# Patient Record
Sex: Female | Born: 1975 | Race: Black or African American | Hispanic: No | Marital: Married | Smoking: Never smoker
Health system: Southern US, Community
[De-identification: ages and names within clinical notes are randomized; demographics above are authoritative.]

## PROBLEM LIST (undated history)

## (undated) DIAGNOSIS — I10 Essential (primary) hypertension: Secondary | ICD-10-CM

## (undated) DIAGNOSIS — E079 Disorder of thyroid, unspecified: Secondary | ICD-10-CM

## (undated) HISTORY — PX: CHOLECYSTECTOMY: SHX55

---

## 2011-03-04 ENCOUNTER — Emergency Department (HOSPITAL_COMMUNITY)
Admission: EM | Admit: 2011-03-04 | Discharge: 2011-03-05 | Disposition: A | Discharge status: Home / Self Care | Payer: Self-pay | Attending: Emergency Medicine | Admitting: Emergency Medicine

## 2011-03-04 ENCOUNTER — Encounter: Payer: Self-pay | Admitting: *Deleted

## 2011-03-04 DIAGNOSIS — R51 Headache: Secondary | ICD-10-CM | POA: Insufficient documentation

## 2011-03-04 DIAGNOSIS — R209 Unspecified disturbances of skin sensation: Secondary | ICD-10-CM | POA: Insufficient documentation

## 2011-03-04 DIAGNOSIS — I10 Essential (primary) hypertension: Secondary | ICD-10-CM | POA: Insufficient documentation

## 2011-03-04 DIAGNOSIS — Z79899 Other long term (current) drug therapy: Secondary | ICD-10-CM | POA: Insufficient documentation

## 2011-03-04 DIAGNOSIS — E876 Hypokalemia: Secondary | ICD-10-CM | POA: Insufficient documentation

## 2011-03-04 DIAGNOSIS — R202 Paresthesia of skin: Secondary | ICD-10-CM

## 2011-03-04 HISTORY — DX: Essential (primary) hypertension: I10

## 2011-03-04 LAB — POCT I-STAT, CHEM 8
Creatinine, Ser: 0.8 mg/dL (ref 0.50–1.10)
Hemoglobin: 14.6 g/dL (ref 12.0–15.0)
Potassium: 2.7 mEq/L — CL (ref 3.5–5.1)
Sodium: 139 mEq/L (ref 135–145)
TCO2: 30 mmol/L (ref 0–100)

## 2011-03-04 MED ORDER — POTASSIUM CHLORIDE CRYS ER 20 MEQ PO TBCR
40.0000 meq | EXTENDED_RELEASE_TABLET | Freq: Once | ORAL | Status: AC
  Administered 2011-03-05: 40 meq via ORAL
  Filled 2011-03-04: qty 2

## 2011-03-04 MED ORDER — POTASSIUM CHLORIDE 10 MEQ/100ML IV SOLN
10.0000 meq | Freq: Once | INTRAVENOUS | Status: AC
  Administered 2011-03-05: 10 meq via INTRAVENOUS
  Filled 2011-03-04: qty 100

## 2011-03-04 NOTE — ED Notes (Signed)
Having blood drawn at this time

## 2011-03-04 NOTE — ED Notes (Signed)
Pt in c/o episode last night where she felt like the right side of her face was numb, no drop noted per family, pt states this resolved but she also had a headache behind her right eye, today had episode of aphasia while on the phone with husband while trying to say one word, checked her BP at home and it was elevated, pt states she is compliant with her medication

## 2011-03-05 MED ORDER — POTASSIUM CHLORIDE ER 10 MEQ PO TBCR: 20.0000 meq | EXTENDED_RELEASE_TABLET | Freq: Two times a day (BID) | ORAL | Status: DC

## 2011-03-05 MED ORDER — SODIUM CHLORIDE 0.9 % IV SOLN
Freq: Once | INTRAVENOUS | Status: AC
  Administered 2011-03-05: via INTRAVENOUS

## 2011-03-05 NOTE — ED Provider Notes (Signed)
Medical screening examination/treatment/procedure(s) were performed by non-physician practitioner and as supervising physician I was immediately available for consultation/collaboration.   Lyanne Co, MD 03/05/11 [phone]

## 2011-03-05 NOTE — ED Provider Notes (Signed)
History     CSN: [id number]  Arrival date & time 03/04/11  1803   First MD Initiated Contact with Patient 03/04/11 2305      Chief Complaint  Patient presents with  . Hypertension  . Headache     HPI  History provided patient. Patient is a 35 year old female with history of hypertension presents with complaints of feeling tingling and numbness on the right side of her face and bilateral upper extremities. Patient reports that symptoms have been somewhat intermittent and gradual. they first began last night. Patient also reports having some difficulty getting out her words on the telephone earlier today. This happened only once during one conversation she has been speaking normally otherwise. She denies any weakness in her arms or legs. Patient does report having a recent GI illness with a lot of diarrhea up until yesterday. Today she denies any abdominal pains or diarrhea. She has not had any nausea or vomiting. She denies any fever, chills, sweats. She does also note that her blood pressure was a little elevated today with systolic 175.    Past Medical History  Diagnosis Date  . Hypertension     History reviewed. No pertinent past surgical history.  History reviewed. No pertinent family history.  History  Substance Use Topics  . Smoking status: Never Smoker   . Smokeless tobacco: Not on file  . Alcohol Use: No    OB History    Grav Para Term Preterm Abortions TAB SAB Ect Mult Living                  Review of Systems  Constitutional: Negative for fever and chills.  Gastrointestinal: Positive for diarrhea. Negative for nausea, vomiting and abdominal pain.  Neurological: Positive for numbness and headaches. Negative for facial asymmetry and weakness.  All other systems reviewed and are negative.    Allergies  Review of patient's allergies indicates no known allergies.  Home Medications   Current Outpatient Rx  Name Route Sig Dispense Refill  . ACETAMINOPHEN  500 MG PO TABS Oral Take 500 mg by mouth every 6 (six) hours as needed. Headache     . HYDROCHLOROTHIAZIDE 25 MG PO TABS Oral Take 25 mg by mouth daily.        BP 152/76  Pulse 76  Temp(Src) 98 F (36.7 C) (Oral)  Resp 20  SpO2 100%  Physical Exam  Nursing note and vitals reviewed. Constitutional: She is oriented to person, place, and time. She appears well-developed and well-nourished. No distress.  HENT:  Head: Normocephalic.  Mouth/Throat: Oropharynx is clear and moist.  Eyes: Conjunctivae and EOM are normal. Pupils are equal, round, and reactive to light.  Neck: Normal range of motion. Neck supple.  Cardiovascular: Normal rate and regular rhythm.   No murmur heard. Pulmonary/Chest: Effort normal and breath sounds normal. She has no wheezes. She has no rales.  Abdominal: Soft. There is no tenderness. There is no rebound and no guarding.  Neurological: She is alert and oriented to person, place, and time. She has normal strength. No cranial nerve deficit or sensory deficit. Coordination and gait normal.  Skin: Skin is warm and dry. No rash noted.  Psychiatric: She has a normal mood and affect. Her behavior is normal.    ED Course  Procedures (including critical care time)  Labs Reviewed  POCT I-STAT, CHEM 8 - Abnormal; Notable for the following:    Potassium 2.7 (*)    Glucose, Bld 126 (*)  All other components within normal limits  I-STAT, CHEM 8   Results for orders placed during the hospital encounter of 03/04/11  POCT I-STAT, CHEM 8      Component Value Range   Sodium 139  135 - 145 (mEq/L)   Potassium 2.7 (*) 3.5 - 5.1 (mEq/L)   Chloride 99  96 - 112 (mEq/L)   BUN 11  6 - 23 (mg/dL)   Creatinine, Ser 1.61  0.50 - 1.10 (mg/dL)   Glucose, Bld 096 (*) 70 - 99 (mg/dL)   Calcium, Ion 0.45  4.09 - 1.32 (mmol/L)   TCO2 30  0 - 100 (mmol/L)   Hemoglobin 14.6  12.0 - 15.0 (g/dL)   HCT 81.1  91.4 - 78.2 (%)   Comment NOTIFIED PHYSICIAN        1. Hypokalemia     2. Paresthesias       MDM  Patient seen and evaluated. Patient no acute distress.   Patient found to be hypokalemic. By mouth and IV potassium ordered.     Angus Seller, [place] 03/05/11 [phone]

## 2012-05-10 ENCOUNTER — Emergency Department (HOSPITAL_COMMUNITY): Payer: PRIVATE HEALTH INSURANCE

## 2012-05-10 ENCOUNTER — Encounter (HOSPITAL_COMMUNITY): Payer: Self-pay | Admitting: Emergency Medicine

## 2012-05-10 ENCOUNTER — Emergency Department (HOSPITAL_COMMUNITY)
Admission: EM | Admit: 2012-05-10 | Discharge: 2012-05-10 | Disposition: A | Discharge status: Home / Self Care | Payer: PRIVATE HEALTH INSURANCE | Attending: Emergency Medicine | Admitting: Emergency Medicine

## 2012-05-10 DIAGNOSIS — Z3201 Encounter for pregnancy test, result positive: Secondary | ICD-10-CM | POA: Insufficient documentation

## 2012-05-10 DIAGNOSIS — O009 Unspecified ectopic pregnancy without intrauterine pregnancy: Secondary | ICD-10-CM

## 2012-05-10 DIAGNOSIS — Z9089 Acquired absence of other organs: Secondary | ICD-10-CM | POA: Insufficient documentation

## 2012-05-10 DIAGNOSIS — Z79899 Other long term (current) drug therapy: Secondary | ICD-10-CM | POA: Insufficient documentation

## 2012-05-10 DIAGNOSIS — R630 Anorexia: Secondary | ICD-10-CM | POA: Insufficient documentation

## 2012-05-10 DIAGNOSIS — K5732 Diverticulitis of large intestine without perforation or abscess without bleeding: Secondary | ICD-10-CM | POA: Insufficient documentation

## 2012-05-10 DIAGNOSIS — R197 Diarrhea, unspecified: Secondary | ICD-10-CM | POA: Insufficient documentation

## 2012-05-10 DIAGNOSIS — N921 Excessive and frequent menstruation with irregular cycle: Secondary | ICD-10-CM | POA: Insufficient documentation

## 2012-05-10 DIAGNOSIS — I10 Essential (primary) hypertension: Secondary | ICD-10-CM | POA: Insufficient documentation

## 2012-05-10 DIAGNOSIS — R110 Nausea: Secondary | ICD-10-CM | POA: Insufficient documentation

## 2012-05-10 LAB — CBC WITH DIFFERENTIAL/PLATELET
Basophils Absolute: 0 10*3/uL (ref 0.0–0.1)
Lymphocytes Relative: 27 % (ref 12–46)
Lymphs Abs: 2.1 10*3/uL (ref 0.7–4.0)
MCV: 80.1 fL (ref 78.0–100.0)
Neutro Abs: 4.8 10*3/uL (ref 1.7–7.7)
Neutrophils Relative %: 62 % (ref 43–77)
Platelets: 353 10*3/uL (ref 150–400)
RBC: 4.62 MIL/uL (ref 3.87–5.11)
RDW: 14 % (ref 11.5–15.5)
WBC: 7.7 10*3/uL (ref 4.0–10.5)

## 2012-05-10 LAB — COMPREHENSIVE METABOLIC PANEL
ALT: 20 U/L (ref 0–35)
AST: 20 U/L (ref 0–37)
Alkaline Phosphatase: 77 U/L (ref 39–117)
CO2: 26 mEq/L (ref 19–32)
Calcium: 9.2 mg/dL (ref 8.4–10.5)
Chloride: 100 mEq/L (ref 96–112)
GFR calc Af Amer: >90 mL/min (ref >90)
GFR calc non Af Amer: >90 mL/min (ref >90)
Glucose, Bld: 93 mg/dL (ref 70–99)
Potassium: 3.6 mEq/L (ref 3.5–5.1)
Sodium: 137 mEq/L (ref 135–145)

## 2012-05-10 LAB — URINE MICROSCOPIC-ADD ON

## 2012-05-10 LAB — URINALYSIS, ROUTINE W REFLEX MICROSCOPIC
Bilirubin Urine: NEGATIVE
Glucose, UA: NEGATIVE mg/dL
Protein, ur: NEGATIVE mg/dL
Urobilinogen, UA: 0.2 mg/dL (ref 0.0–1.0)

## 2012-05-10 LAB — HCG, QUANTITATIVE, PREGNANCY: hCG, Beta Chain, Quant, S: 540 m[IU]/mL — ABNORMAL HIGH (ref <5)

## 2012-05-10 LAB — PREGNANCY, URINE: Preg Test, Ur: POSITIVE — AB

## 2012-05-10 MED ORDER — METHOTREXATE 2.5 MG PO TABS: 50.0000 mg/m2 | ORAL_TABLET | Freq: Once | ORAL | Status: DC

## 2012-05-10 MED ORDER — ONDANSETRON HCL 4 MG/2ML IJ SOLN
4.0000 mg | Freq: Once | INTRAMUSCULAR | Status: AC
  Administered 2012-05-10: 4 mg via INTRAVENOUS
  Filled 2012-05-10: qty 2

## 2012-05-10 MED ORDER — METHOTREXATE SODIUM CHEMO INJECTION 25 MG/ML
50.0000 mg/m2 | Freq: Once | INTRAMUSCULAR | Status: AC
  Administered 2012-05-10: 117.5 mg via INTRAMUSCULAR
  Filled 2012-05-10 (×2): qty 4.7

## 2012-05-10 MED ORDER — IOHEXOL 300 MG/ML  SOLN
50.0000 mL | Freq: Once | INTRAMUSCULAR | Status: AC | PRN
  Administered 2012-05-10: 50 mL via ORAL

## 2012-05-10 MED ORDER — HYDROCODONE-ACETAMINOPHEN 5-325 MG PO TABS: 1.0000 | ORAL_TABLET | Freq: Four times a day (QID) | ORAL | Status: DC | PRN

## 2012-05-10 MED ORDER — SODIUM CHLORIDE 0.9 % IV SOLN
1000.0000 mL | Freq: Once | INTRAVENOUS | Status: AC
  Administered 2012-05-10: 1000 mL via INTRAVENOUS

## 2012-05-10 NOTE — ED Notes (Signed)
Pt unable to provide urine at this time

## 2012-05-10 NOTE — ED Notes (Signed)
Pt transported to Ultrasound.  

## 2012-05-10 NOTE — ED Provider Notes (Signed)
History     CSN: [id number]  Arrival date & time 05/10/12  4098   First MD Initiated Contact with Patient 05/10/12 [phone]      No chief complaint on file.   HPI The patient presents with abdominal pain, anorexia, bowel movement changes.  This episode began approximately 2 weeks ago.  Since that time she said pain in the left abdomen, fluctuating between the superior and inferior portions.  The pain is pressure like, heavy and minimally improved with hydrocodone.  There is associated nausea, anorexia, and the patient now has looser, more infrequent bowel movements. The day after initiation of symptoms she saw a physician, was started on antibiotics for presumed diverticulitis.  She has completed those medications, states that her symptoms persist.  No new other concerns. The patient states that she has a possible prior diagnosis of IBS, though no prior colonoscopy.  She has a history of prior cholecystectomy. She also has a history of irregular menstrual cycle, and is currently having spotting, though she states that this is typical for her..  Past Medical History  Diagnosis Date  . Hypertension     Past Surgical History  Procedure Laterality Date  . Cholecystectomy      No family history on file.  History  Substance Use Topics  . Smoking status: Never Smoker   . Smokeless tobacco: Not on file  . Alcohol Use: No    OB History   Grav Para Term Preterm Abortions TAB SAB Ect Mult Living                  Review of Systems  Constitutional:       Per HPI, otherwise negative  HENT:       Per HPI, otherwise negative  Respiratory:       Per HPI, otherwise negative  Cardiovascular:       Per HPI, otherwise negative  Gastrointestinal: Positive for nausea, abdominal pain and diarrhea. Negative for vomiting, constipation, blood in stool, anal bleeding and rectal pain.  Endocrine:       Negative aside from HPI  Genitourinary:       Neg aside from HPI   Musculoskeletal:   Per HPI, otherwise negative  Skin: Negative.   Neurological: Negative for syncope.    Allergies  Review of patient's allergies indicates no known allergies.  Home Medications   Current Outpatient Rx  Name  Route  Sig  Dispense  Refill  . acetaminophen (TYLENOL) 500 MG tablet   Oral   Take 500 mg by mouth every 6 (six) hours as needed. Headache          . hydrochlorothiazide (HYDRODIURIL) 25 MG tablet   Oral   Take 25 mg by mouth daily.           Marland Kitchen EXPIRED: potassium chloride (K-DUR) 10 MEQ tablet   Oral   Take 2 tablets (20 mEq total) by mouth 2 (two) times daily.   60 tablet   0     BP 91/63  Pulse 91  Temp(Src) 97.9 F (36.6 C) (Oral)  SpO2 99%  LMP 05/05/2012  Physical Exam  Abdominal: There is tenderness in the epigastric area, periumbilical area, left upper quadrant and left lower quadrant. There is guarding. There is no rigidity and no rebound.    ED Course  [place] bedside Date/Time: 05/10/2012 11:00 AM Performed by: Gerhard Munch Authorized by: Gerhard Munch Consent: Verbal consent obtained. The procedure was performed in an emergent situation. Risks and benefits:  risks, benefits and alternatives were discussed Consent given by: patient Patient understanding: patient states understanding of the procedure being performed Patient consent: the patient's understanding of the procedure matches consent given Procedure consent: procedure consent matches procedure scheduled Relevant documents: relevant documents present and verified Test results: test results available and properly labeled Site marked: the operative site was marked Imaging studies: imaging studies available Required items: required blood products, implants, devices, and special equipment available Patient identity confirmed: verbally with patient Time out: Immediately prior to procedure a "time out" was called to verify the correct patient, procedure, equipment, support staff and site/side  marked as required. Preparation: Patient was prepped and draped in the usual sterile fashion. Local anesthesia used: no Patient sedated: no Patient tolerance: Patient tolerated the procedure well with no immediate complications. Comments: Gestational sac visualized, but no pole, no FHT. No free fluid in the peritoneal cavity   (including critical care time)  Labs Reviewed  CBC WITH DIFFERENTIAL  COMPREHENSIVE METABOLIC PANEL  LIPASE, BLOOD  URINALYSIS, ROUTINE W REFLEX MICROSCOPIC  PREGNANCY, URINE   No results found.   No diagnosis found.    MDM  This patient presents with ongoing abdominal pain, nausea, anorexia.  Given her recent clinical diagnosis of diverticulitis, as well as her endorsement of bowel habit changes, there was initial suspicion of GI etiology.  However, initial labs demonstrated the patient is pregnant.  After initial visit ultrasound did not demonstrate I. you today, the patient had a formal ultrasound which demonstrated ectopic pregnancy the with her ongoing pain, I discussed the case several times with our obstetricians.  Absent distress, and with a very low hCG, the patient received methotrexate therapy here.  She and I discussed this intervention, the need for followup as scheduled by me with the obstetrics team and 4 days, the need to return for any concerning changes in her condition.  Gerhard Munch, MD 05/10/12 [phone]

## 2012-05-10 NOTE — ED Notes (Signed)
Pt reports abdominal pain that started 2 weeks ago after eating with sudden onset at 10/10. Pt went to PCP and was treated for diverticulitis with 2 antibiotics. Pt continues to have intermittent pain and today pain is 8/10. Pt reports pain and pressure when having a bowel movement and when urinating. Pt denies burning or freq. Pt denies n/v/d today. Has had some nausea at over the past 2 weeks.

## 2012-05-10 NOTE — ED Notes (Signed)
Voiced understanding of instructions given 

## 2012-05-10 NOTE — ED Notes (Signed)
Pt reports spotting for the past 2 weeks. "no real full period"

## 2012-05-10 NOTE — ED Notes (Signed)
Waiting for Medication from Pharmacy.

## 2012-05-14 ENCOUNTER — Encounter: Payer: Self-pay | Admitting: Obstetrics & Gynecology

## 2012-05-14 ENCOUNTER — Ambulatory Visit (INDEPENDENT_AMBULATORY_CARE_PROVIDER_SITE_OTHER): Payer: PRIVATE HEALTH INSURANCE | Admitting: Obstetrics & Gynecology

## 2012-05-14 VITALS — BP 133/86 | HR 92 | Temp 97.5°F | Ht 62.0 in | Wt 272.0 lb

## 2012-05-14 DIAGNOSIS — O009 Unspecified ectopic pregnancy without intrauterine pregnancy: Secondary | ICD-10-CM

## 2012-05-14 NOTE — Progress Notes (Signed)
Subjective:     Patient ID: Alexandra Frank, female   DOB: [birth year], 37 y.o.   MRN: [id number]  HPI  Pt reports a LMP on late Jan.  Pt  Is a 36yo G1P0010 with a h/o irreg cycles.  Pt has been with the same partner for 10 years.  Married x 2 years.  This is the first pregnancy for both parties. Pt c/o pain in pelvis for 2 weeks prior to her ER visit.  She was discovered to have a nonviable 7 week ectopic pregnancy. She reports markedly decreased pain since she received the MTX.    Past Medical History  Diagnosis Date  . Hypertension    Past Surgical History  Procedure Laterality Date  . Cholecystectomy     Current Outpatient Prescriptions on File Prior to Visit  Medication Sig Dispense Refill  . albuterol (PROVENTIL HFA;VENTOLIN HFA) 108 (90 BASE) MCG/ACT inhaler Inhale 2 puffs into the lungs every 6 (six) hours as needed for wheezing.      Marland Kitchen HYDROcodone-acetaminophen (NORCO/VICODIN) 5-325 MG per tablet Take 1 tablet by mouth every 6 (six) hours as needed for pain.  15 tablet  0  . lisinopril-hydrochlorothiazide (PRINZIDE,ZESTORETIC) 10-12.5 MG per tablet Take 1 tablet by mouth every morning.      . ranitidine (ZANTAC) 150 MG tablet Take 150 mg by mouth daily as needed for heartburn.       No current facility-administered medications on file prior to visit.  No Known Allergies History   Social History  . Marital Status: Married    Spouse Name: N/A    Number of Children: N/A  . Years of Education: N/A   Occupational History  . Not on file.   Social History Main Topics  . Smoking status: Never Smoker   . Smokeless tobacco: Not on file  . Alcohol Use: No  . Drug Use: No  . Sexually Active: Not on file   Other Topics Concern  . Not on file   Social History Narrative  . No narrative on file       Review of Systems     Objective:   Physical Exam BP 133/86  Pulse 92  Temp(Src) 97.5 F (36.4 C) (Oral)  Ht 5\' 2"  (1.575 m)  Wt 272 lb (123.378 kg)  BMI 49.74 kg/m2   LMP 05/05/2012 Pt in NAD Lungs: CTA CV: RRR Abd: obese, NT; ND GYN: not done  Quant hcg 04/12/2012 540      Assessment:     Ectopic pregnancy- s/p MTX 4 days prev      Plan:     Repeat quant HCG F/u on post treatment day #7 or sooner prn Reviewed with pt the signs and sx to be aware of with an ectopic

## 2012-05-14 NOTE — Patient Instructions (Addendum)
Ectopic Pregnancy  An ectopic pregnancy is when the fertilized egg attaches (implants) outside the uterus. Most ectopic pregnancies occur in the fallopian tube. Rarely do ectopic pregnancies occur on the ovary, intestine, pelvis, or cervix. An ectopic pregnancy does not have the ability to develop into a normal, healthy baby.   A ruptured ectopic pregnancy is one in which the fallopian tube gets torn or bursts and results in internal bleeding. Often there is intense abdominal pain, and sometimes, vaginal bleeding. Having an ectopic pregnancy can be a life-threatening experience. If left untreated, this dangerous condition can lead to a blood transfusion, abdominal surgery, or even death.  CAUSES   Damage to the fallopian tubes is the suspected cause in most ectopic pregnancies.   RISK FACTORS  Depending on your circumstances, the amount of risk of having an ectopic pregnancy will vary. There are 3 categories that may help you identify whether you are potentially at risk.  High Risk   You have gone through infertility treatment.   You have had a previous ectopic pregnancy.   You have had previous tubal surgery.   You have had previous surgery to have the fallopian tubes tied (tubal ligation).   You have tubal problems or diseases.   You have been exposed to DES. DES is a medicine that was used until 1971 and had effects on babies whose mothers took the medicine.   You become pregnant while using an intrauterine device (IUD) for birth control.  Moderate Risk   You have a history of infertility.   You have a history of a sexually transmitted infection (STI).   You have a history of pelvic inflammatory disease (PID).   You have scarring from endometriosis.   You have multiple sexual partners.   You smoke.  Low Risk   You have had previous pelvic surgery.   You use vaginal douching.   You became sexually active before 36 years of age.  SYMPTOMS   An ectopic pregnancy should be suspected in anyone who  has missed a period and has abdominal pain or bleeding.   You may experience normal pregnancy symptoms, such as:   Nausea.   Tiredness.   Breast tenderness.   Symptoms that are not normal include:   Pain with intercourse.   Irregular vaginal bleeding or spotting.   Cramping or pain on one side, or in the lower abdomen.   Fast heartbeat.   Passing out while having a bowel movement.   Symptoms of a ruptured ectopic pregnancy and internal bleeding may include:   Sudden, severe pain in the abdomen and pelvis.   Dizziness or fainting.   Pain in the shoulder area.  DIAGNOSIS   Tests that may be performed include:   A pregnancy test.   An ultrasound.   Testing the specific level of pregnancy hormone in the bloodstream.   Taking a sample of uterus tissue (dilation and curettage, D&C).   Surgery to perform a visual exam of the inside of the abdomen using a lighted tube (laparoscopy).  TREATMENT   An injection of methotrexate medicine may be given. This is given if:   The diagnosis is made early.   The fallopian tube has not ruptured.   You are considered to be a good candidate for the medicine.  Usually, pregnancy hormone blood levels are checked after methotrexate treatment. This is to be sure the medicine is effective. It may take 4 to 6 weeks for the pregnancy to be absorbed (though most pregnancies   will be absorbed by 3 weeks).  Surgical treatment may be needed. A lighted tube (laparoscope) is used to remove the tubal pregnancy. If severe internal bleeding occurs, a cut (incision) may be made in the lower abdomen (laparotomy), and the ectopic pregnancy is removed. This stops the bleeding. Part of the fallopian tube, or the whole tube, may be removed as well (salpingectomy). After surgery, pregnancy hormone tests may be done to be sure there is no pregnancy tissue left. You may receive a RhoGAM shot if you are Rh negative and the father is Rh positive, or if you do not know the Rh type of the father.  This is to prevent problems with any future pregnancy.  SEEK IMMEDIATE MEDICAL CARE IF:   You have any symptoms of an ectopic pregnancy. This is a medical emergency.  Document Released: 03/31/2004 Document Revised: 05/16/2011 Document Reviewed: 10/28/2010  [employer] Patient Information 2013 [employer]

## 2012-05-15 ENCOUNTER — Telehealth: Payer: Self-pay | Admitting: *Deleted

## 2012-05-15 NOTE — Telephone Encounter (Addendum)
Message copied by Jill Side on Tue May 15, 2012 11:58 AM ------      Message from: Willodean Rosenthal      Created: Tue May 15, 2012  9:09 AM       Please call patient and let her know that she can f/u in 1 week for a repeat quant hcg.  Her level is decreasing.            Dx: s/p MTX for ectopic pregnancy            Thx,            clh-S ------

## 2012-05-15 NOTE — Telephone Encounter (Signed)
Pt notified of test result and that she will need repeat hormone level done on 05/21/12. Pt voiced understanding and stated that she will come @ 0730 on 05/21/12.

## 2012-05-17 ENCOUNTER — Other Ambulatory Visit: Payer: PRIVATE HEALTH INSURANCE

## 2012-05-21 ENCOUNTER — Other Ambulatory Visit: Payer: PRIVATE HEALTH INSURANCE

## 2012-05-21 DIAGNOSIS — O009 Unspecified ectopic pregnancy without intrauterine pregnancy: Secondary | ICD-10-CM

## 2012-05-21 LAB — HCG, QUANTITATIVE, PREGNANCY: hCG, Beta Chain, Quant, S: 50.2 m[IU]/mL

## 2012-05-24 ENCOUNTER — Telehealth: Payer: Self-pay | Admitting: *Deleted

## 2012-05-24 NOTE — Telephone Encounter (Signed)
[facility] and informed her of lab result and need for repeat in one week- requested 0730 on 05/28/12

## 2012-05-24 NOTE — Telephone Encounter (Signed)
Message copied by Gerome Apley on Thu May 24, 2012  9:06 AM ------      Message from: Adam Phenix      Created: Wed May 23, 2012  2:30 PM       Repeat hcg in one week ------

## 2012-05-28 ENCOUNTER — Other Ambulatory Visit: Payer: Self-pay

## 2012-05-28 DIAGNOSIS — O009 Unspecified ectopic pregnancy without intrauterine pregnancy: Secondary | ICD-10-CM

## 2012-05-28 LAB — HCG, QUANTITATIVE, PREGNANCY: hCG, Beta Chain, Quant, S: 10.5 m[IU]/mL

## 2012-06-04 ENCOUNTER — Encounter: Payer: Self-pay | Admitting: Obstetrics & Gynecology

## 2012-06-04 ENCOUNTER — Ambulatory Visit (INDEPENDENT_AMBULATORY_CARE_PROVIDER_SITE_OTHER): Payer: Self-pay | Admitting: Obstetrics & Gynecology

## 2012-06-04 VITALS — BP 134/89 | HR 95 | Temp 97.1°F | Wt 274.0 lb

## 2012-06-04 DIAGNOSIS — O009 Unspecified ectopic pregnancy without intrauterine pregnancy: Secondary | ICD-10-CM

## 2012-06-04 MED ORDER — PRENATAL-U 106.5-1 MG PO CAPS: 1.0000 | ORAL_CAPSULE | Freq: Every morning | ORAL | Status: DC

## 2012-06-04 NOTE — Patient Instructions (Signed)
Ectopic Pregnancy  An ectopic pregnancy is when the fertilized egg attaches (implants) outside the uterus. Most ectopic pregnancies occur in the fallopian tube. Rarely do ectopic pregnancies occur on the ovary, intestine, pelvis, or cervix. An ectopic pregnancy does not have the ability to develop into a normal, healthy baby.   A ruptured ectopic pregnancy is one in which the fallopian tube gets torn or bursts and results in internal bleeding. Often there is intense abdominal pain, and sometimes, vaginal bleeding. Having an ectopic pregnancy can be a life-threatening experience. If left untreated, this dangerous condition can lead to a blood transfusion, abdominal surgery, or even death.  CAUSES   Damage to the fallopian tubes is the suspected cause in most ectopic pregnancies.   RISK FACTORS  Depending on your circumstances, the amount of risk of having an ectopic pregnancy will vary. There are 3 categories that may help you identify whether you are potentially at risk.  High Risk   You have gone through infertility treatment.   You have had a previous ectopic pregnancy.   You have had previous tubal surgery.   You have had previous surgery to have the fallopian tubes tied (tubal ligation).   You have tubal problems or diseases.   You have been exposed to DES. DES is a medicine that was used until 1971 and had effects on babies whose mothers took the medicine.   You become pregnant while using an intrauterine device (IUD) for birth control.  Moderate Risk   You have a history of infertility.   You have a history of a sexually transmitted infection (STI).   You have a history of pelvic inflammatory disease (PID).   You have scarring from endometriosis.   You have multiple sexual partners.   You smoke.  Low Risk   You have had previous pelvic surgery.   You use vaginal douching.   You became sexually active before 36 years of age.  SYMPTOMS   An ectopic pregnancy should be suspected in anyone who  has missed a period and has abdominal pain or bleeding.   You may experience normal pregnancy symptoms, such as:   Nausea.   Tiredness.   Breast tenderness.   Symptoms that are not normal include:   Pain with intercourse.   Irregular vaginal bleeding or spotting.   Cramping or pain on one side, or in the lower abdomen.   Fast heartbeat.   Passing out while having a bowel movement.   Symptoms of a ruptured ectopic pregnancy and internal bleeding may include:   Sudden, severe pain in the abdomen and pelvis.   Dizziness or fainting.   Pain in the shoulder area.  DIAGNOSIS   Tests that may be performed include:   A pregnancy test.   An ultrasound.   Testing the specific level of pregnancy hormone in the bloodstream.   Taking a sample of uterus tissue (dilation and curettage, D&C).   Surgery to perform a visual exam of the inside of the abdomen using a lighted tube (laparoscopy).  TREATMENT   An injection of methotrexate medicine may be given. This is given if:   The diagnosis is made early.   The fallopian tube has not ruptured.   You are considered to be a good candidate for the medicine.  Usually, pregnancy hormone blood levels are checked after methotrexate treatment. This is to be sure the medicine is effective. It may take 4 to 6 weeks for the pregnancy to be absorbed (though most pregnancies   will be absorbed by 3 weeks).  Surgical treatment may be needed. A lighted tube (laparoscope) is used to remove the tubal pregnancy. If severe internal bleeding occurs, a cut (incision) may be made in the lower abdomen (laparotomy), and the ectopic pregnancy is removed. This stops the bleeding. Part of the fallopian tube, or the whole tube, may be removed as well (salpingectomy). After surgery, pregnancy hormone tests may be done to be sure there is no pregnancy tissue left. You may receive a RhoGAM shot if you are Rh negative and the father is Rh positive, or if you do not know the Rh type of the father.  This is to prevent problems with any future pregnancy.  SEEK IMMEDIATE MEDICAL CARE IF:   You have any symptoms of an ectopic pregnancy. This is a medical emergency.  Document Released: 03/31/2004 Document Revised: 05/16/2011 Document Reviewed: 10/28/2010  [employer] Patient Information 2013 [employer].

## 2012-06-04 NOTE — Progress Notes (Signed)
Subjective:     Patient ID: Alexandra Frank, female   DOB: 18-Oct-1975, 37 y.o.   MRN: [id number]  HPI Pt had ectopic pregnancy.  Had prolonged bleeding which has now resolved.  She is not interested in contraception at this time. No problems currently.  No pain.  Review of Systems     Objective:   Physical Exam BP 134/89  Pulse 95  Temp(Src) 97.1 F (36.2 C) (Oral)  Wt 274 lb (124.286 kg)  BMI 50.1 kg/m2  LMP 05/05/2012 Exam deferred     Assessment:     Ectopic pregnancy- for HCG f/u today      Plan:    Quant Hcg today F/u for annual  PNV 1 po q day

## 2012-10-22 ENCOUNTER — Emergency Department (HOSPITAL_COMMUNITY): Payer: PRIVATE HEALTH INSURANCE

## 2012-10-22 ENCOUNTER — Emergency Department (HOSPITAL_COMMUNITY)
Admission: EM | Admit: 2012-10-22 | Discharge: 2012-10-22 | Disposition: A | Discharge status: Home / Self Care | Payer: PRIVATE HEALTH INSURANCE | Attending: Emergency Medicine | Admitting: Emergency Medicine

## 2012-10-22 ENCOUNTER — Encounter (HOSPITAL_COMMUNITY): Payer: Self-pay | Admitting: *Deleted

## 2012-10-22 DIAGNOSIS — M79609 Pain in unspecified limb: Secondary | ICD-10-CM | POA: Insufficient documentation

## 2012-10-22 DIAGNOSIS — M546 Pain in thoracic spine: Secondary | ICD-10-CM | POA: Insufficient documentation

## 2012-10-22 DIAGNOSIS — R209 Unspecified disturbances of skin sensation: Secondary | ICD-10-CM | POA: Insufficient documentation

## 2012-10-22 DIAGNOSIS — I10 Essential (primary) hypertension: Secondary | ICD-10-CM | POA: Insufficient documentation

## 2012-10-22 DIAGNOSIS — R42 Dizziness and giddiness: Secondary | ICD-10-CM | POA: Insufficient documentation

## 2012-10-22 DIAGNOSIS — Z86711 Personal history of pulmonary embolism: Secondary | ICD-10-CM | POA: Insufficient documentation

## 2012-10-22 DIAGNOSIS — R071 Chest pain on breathing: Secondary | ICD-10-CM | POA: Insufficient documentation

## 2012-10-22 DIAGNOSIS — Z79899 Other long term (current) drug therapy: Secondary | ICD-10-CM | POA: Insufficient documentation

## 2012-10-22 DIAGNOSIS — R0789 Other chest pain: Secondary | ICD-10-CM

## 2012-10-22 DIAGNOSIS — Z3202 Encounter for pregnancy test, result negative: Secondary | ICD-10-CM | POA: Insufficient documentation

## 2012-10-22 DIAGNOSIS — Z86718 Personal history of other venous thrombosis and embolism: Secondary | ICD-10-CM | POA: Insufficient documentation

## 2012-10-22 LAB — CBC WITH DIFFERENTIAL/PLATELET
Eosinophils Relative: 3 % (ref 0–5)
Hemoglobin: 12.9 g/dL (ref 12.0–15.0)
Lymphocytes Relative: 50 % — ABNORMAL HIGH (ref 12–46)
Lymphs Abs: 3.7 10*3/uL (ref 0.7–4.0)
MCH: 26.8 pg (ref 26.0–34.0)
MCHC: 32.8 g/dL (ref 30.0–36.0)
Monocytes Absolute: 0.4 10*3/uL (ref 0.1–1.0)
Neutro Abs: 3 10*3/uL (ref 1.7–7.7)
RDW: 13.6 % (ref 11.5–15.5)

## 2012-10-22 LAB — BASIC METABOLIC PANEL
CO2: 29 mEq/L (ref 19–32)
Calcium: 9.4 mg/dL (ref 8.4–10.5)
Chloride: 100 mEq/L (ref 96–112)
Glucose, Bld: 99 mg/dL (ref 70–99)
Sodium: 137 mEq/L (ref 135–145)

## 2012-10-22 LAB — POCT I-STAT TROPONIN I

## 2012-10-22 LAB — POCT PREGNANCY, URINE: Preg Test, Ur: NEGATIVE

## 2012-10-22 MED ORDER — POTASSIUM CHLORIDE CRYS ER 20 MEQ PO TBCR
40.0000 meq | EXTENDED_RELEASE_TABLET | Freq: Once | ORAL | Status: AC
  Administered 2012-10-22: 40 meq via ORAL
  Filled 2012-10-22: qty 2

## 2012-10-22 MED ORDER — IBUPROFEN 800 MG PO TABS
800.0000 mg | ORAL_TABLET | Freq: Once | ORAL | Status: AC
  Administered 2012-10-22: 800 mg via ORAL
  Filled 2012-10-22: qty 1

## 2012-10-22 NOTE — Progress Notes (Signed)
[facility] ED CM noted pt with coverage but no pcp listed Spoke with pt who confirms no pcp  [facility] ED CM spoke with pt on how to obtain an in network pcp with insurance coverage via the customer service number or web site  CM encouraged pt and discussed pt's responsibility to verify with pt's insurance carrier that any recommended medical provider offered by any emergency room or a hospital provider is within the carrier's network. The pt voiced understanding      

## 2012-10-22 NOTE — ED Provider Notes (Signed)
CSN: [id number]     Arrival date & time 10/22/12  1630 History     First MD Initiated Contact with Patient 10/22/12 1639     Chief Complaint  Patient presents with  . Chest Pain   (Consider location/radiation/quality/duration/timing/severity/associated sxs/prior Treatment) HPI Comments: Patient is a 37 year old female with a history of hypertension who presents for left-sided chest pain with onset this morning. Patient states that pain in her left axilla, left anterior chest, and left thoracic back. The symptoms have been constant since onset without any alleviating factors; pain worse with palpation and certain movements. She describes the pain as an aching pressure with associated numbness in her L hand and feeling like her "left arm is falling asleep". Patient further admits to some associated dizziness this morning. She denies associated fever, diaphoresis, cough, syncope, vision changes, jaw pain, shortness of breath, nausea or vomiting, abdominal pain, peripheral edema, calf tenderness or claudication, and extremity weakness. Patient admits to a miscarriage in March 2014. She denies the use of contraceptives, hospitalizations or surgeries within the past month, and long car trips or plane flights recently. Patient denies history of DM, HLD, coronary artery disease, tobacco use, and ACS. Patient also denies a history of DVT or PE. FHx includes CHF in sister, diagnosed in mid 93's; denies FHx of ACS, CAD, stroke and DM.  Patient is a 37 y.o. female presenting with chest pain. The history is provided by the patient. No language interpreter was used.  Chest Pain Associated symptoms: dizziness and numbness   Associated symptoms: no abdominal pain, no diaphoresis, no fever, no nausea, no shortness of breath, not vomiting and no weakness     Past Medical History  Diagnosis Date  . Hypertension    Past Surgical History  Procedure Laterality Date  . Cholecystectomy     Family History   Problem Relation Age of Onset  . Diabetes Mother   . Kidney disease Mother   . Alcohol abuse Father   . Diabetes Sister   . Heart disease Sister   . Mental retardation Sister    History  Substance Use Topics  . Smoking status: Never Smoker   . Smokeless tobacco: Never Used  . Alcohol Use: No   OB History   Grav Para Term Preterm Abortions TAB SAB Ect Mult Living                 Review of Systems  Constitutional: Negative for fever and diaphoresis.  Respiratory: Negative for shortness of breath.   Cardiovascular: Positive for chest pain. Negative for leg swelling.  Gastrointestinal: Negative for nausea, vomiting and abdominal pain.  Neurological: Positive for dizziness and numbness. Negative for syncope and weakness.  All other systems reviewed and are negative.    Allergies  Review of patient's allergies indicates no known allergies.  Home Medications   Current Outpatient Rx  Name  Route  Sig  Dispense  Refill  . acetaminophen (TYLENOL) 500 MG tablet   Oral   Take 500-1,000 mg by mouth every 6 (six) hours as needed for pain.         Marland Kitchen lisinopril-hydrochlorothiazide (PRINZIDE,ZESTORETIC) 10-12.5 MG per tablet   Oral   Take 1 tablet by mouth every morning.         Marland Kitchen albuterol (PROVENTIL HFA;VENTOLIN HFA) 108 (90 BASE) MCG/ACT inhaler   Inhalation   Inhale 2 puffs into the lungs every 6 (six) hours as needed for wheezing.          BP  113/46  Pulse 66  Temp(Src) 97.5 F (36.4 C) (Oral)  Resp 16  SpO2 100%  LMP 10/21/2012  Physical Exam  Nursing note and vitals reviewed. Constitutional: She is oriented to person, place, and time. She appears well-developed and well-nourished. No distress.  Well and nontoxic appearing obese female  HENT:  Head: Normocephalic and atraumatic.  Eyes: Conjunctivae and EOM are normal. No scleral icterus.  Neck: Normal range of motion.  Cardiovascular: Normal rate, regular rhythm, normal heart sounds and intact distal  pulses.   Pulmonary/Chest: Effort normal. No respiratory distress. She has no wheezes. She has no rales. She exhibits tenderness (TTP of L anterior chest and under L axilla).  Abdominal: Soft. There is no tenderness. There is no rebound and no guarding.  Musculoskeletal: Normal range of motion.  Neurological: She is alert and oriented to person, place, and time. She has normal strength and normal reflexes. No sensory deficit. She exhibits normal muscle tone. GCS eye subscore is 4. GCS verbal subscore is 5. GCS motor subscore is 6.  No sensory or motor deficits appreciated. Patient equal grip strength bilaterally with normal strength against resistance and muscle tone.  Skin: Skin is warm and dry. No rash noted. She is not diaphoretic. No erythema. No pallor.  No redness, induration, heat-to-touch, or swelling of the L axilla  Psychiatric: She has a normal mood and affect. Her behavior is normal.    ED Course   Procedures (including critical care time)  Labs Reviewed  CBC WITH DIFFERENTIAL - Abnormal; Notable for the following:    Neutrophils Relative % 41 (*)    Lymphocytes Relative 50 (*)    All other components within normal limits  BASIC METABOLIC PANEL - Abnormal; Notable for the following:    Potassium 3.3 (*)    All other components within normal limits  D-DIMER, QUANTITATIVE  POCT PREGNANCY, URINE  POCT I-STAT TROPONIN I    Date: 10/22/2012  Rate: 71  Rhythm: normal sinus rhythm  QRS Axis: normal  Intervals: normal  ST/T Wave abnormalities: normal  Conduction Disutrbances:none  Narrative Interpretation: NSR without STEMI or ischemic changes  Old EKG Reviewed: none available I have personally reviewed and interpreted this EKG  Dg Chest 2 View  10/22/2012   *RADIOLOGY REPORT*  Clinical Data: Chest pain  CHEST - 2 VIEW  Comparison: None  Findings: The cardiomediastinal silhouette is unremarkable. There is no evidence of focal airspace disease, pulmonary edema, suspicious  pulmonary nodule/mass, pleural effusion, or pneumothorax. No acute bony abnormalities are identified.  IMPRESSION: No evidence of active cardiopulmonary disease.   Original Report Authenticated By: Harmon Pier, M.D.   1. Chest wall pain    MDM  Patient with history of hypertension presents for left-sided chest pain with onset this morning with associated dizziness and a numbing sensation in her left hand. Physical exam as above; patient neurovascularly intact and hemodynamically stable. Lab significant, only, for mild hypokalemia. K-Dur ordered. Chest x-ray without evidence of pneumonia, pleural effusion, pneumothorax, or other acute cardiopulmonary abnormality. EKG without STEMI or ischemic changes. Troponin 0.01. Get an atypical nature of symptoms, reproducibility of pain and physical exam, and reassuring workup, doubt ACS as cause of symptoms. Patient also low risk for ACS with TIMI risk score of 0. Doubt pulmonary embolism given negative d-dimer and lack of tachycardia, tachypnea, dyspnea, and hypoxia. Symptoms most likely musculoskeletal in origin. Have reviewed the results with the patient verbalizes understanding. Appropriate for discharge with primary care followup for further evaluation of symptoms.  Ibuprofen and ice to the affected area recommended for discomfort. Indications for ED return discussed and patient agreeable to discharge plan.      Antony Madura, [place] 10/22/12 Windell Moment

## 2012-10-22 NOTE — ED Notes (Signed)
Pt states today around 9 am started having L sided chest pain radiating under L arm around to back, pt states having dizziness, shortness of breath comes and goes, denies n/v/d, pt states L arm feels numb. Chest pain pressure and aching 6/10.

## 2012-10-23 NOTE — ED Provider Notes (Signed)
Medical screening examination/treatment/procedure(s) were performed by non-physician practitioner and as supervising physician I was immediately available for consultation/collaboration.    Vida Roller, MD 10/23/12 [phone]

## 2012-12-31 ENCOUNTER — Emergency Department (HOSPITAL_COMMUNITY)
Admission: EM | Admit: 2012-12-31 | Discharge: 2012-12-31 | Disposition: A | Discharge status: Home / Self Care | Payer: PRIVATE HEALTH INSURANCE | Attending: Emergency Medicine | Admitting: Emergency Medicine

## 2012-12-31 ENCOUNTER — Encounter (HOSPITAL_COMMUNITY): Payer: Self-pay | Admitting: Emergency Medicine

## 2012-12-31 ENCOUNTER — Emergency Department (HOSPITAL_COMMUNITY): Payer: PRIVATE HEALTH INSURANCE

## 2012-12-31 DIAGNOSIS — R079 Chest pain, unspecified: Secondary | ICD-10-CM

## 2012-12-31 DIAGNOSIS — Z79899 Other long term (current) drug therapy: Secondary | ICD-10-CM | POA: Insufficient documentation

## 2012-12-31 DIAGNOSIS — R071 Chest pain on breathing: Secondary | ICD-10-CM | POA: Insufficient documentation

## 2012-12-31 DIAGNOSIS — M549 Dorsalgia, unspecified: Secondary | ICD-10-CM | POA: Insufficient documentation

## 2012-12-31 DIAGNOSIS — I10 Essential (primary) hypertension: Secondary | ICD-10-CM | POA: Insufficient documentation

## 2012-12-31 LAB — POCT I-STAT TROPONIN I: Troponin i, poc: 0 ng/mL (ref 0.00–0.08)

## 2012-12-31 MED ORDER — CYCLOBENZAPRINE HCL 10 MG PO TABS
10.0000 mg | ORAL_TABLET | Freq: Once | ORAL | Status: AC
  Administered 2012-12-31: 10 mg via ORAL
  Filled 2012-12-31: qty 1

## 2012-12-31 MED ORDER — TRAMADOL HCL 50 MG PO TABS
50.0000 mg | ORAL_TABLET | Freq: Once | ORAL | Status: AC
  Administered 2012-12-31: 50 mg via ORAL
  Filled 2012-12-31: qty 1

## 2012-12-31 MED ORDER — TRAMADOL HCL 50 MG PO TABS: 50.0000 mg | ORAL_TABLET | Freq: Three times a day (TID) | ORAL | Status: DC | PRN

## 2012-12-31 NOTE — ED Provider Notes (Signed)
CSN: [id number]     Arrival date & time 12/31/12  1451 History   First MD Initiated Contact with Patient 12/31/12 1507     Chief Complaint  Patient presents with  . Chest Pain  . Back Pain   (Consider location/radiation/quality/duration/timing/severity/associated sxs/prior Treatment) Patient is a 37 y.o. female presenting with chest pain. The history is provided by the patient.  Chest Pain Pain location:  L chest and L lateral chest Pain quality: dull and sharp   Pain radiates to:  Does not radiate Pain radiates to the back: yes   Pain severity:  Mild Onset quality:  Gradual Timing:  Intermittent Progression:  Unchanged Chronicity:  New Context: not breathing, no drug use, no movement, not raising an arm, not at rest and no stress   Relieved by:  Nothing Worsened by:  Nothing tried Associated symptoms: no abdominal pain, no cough, no fever, no shortness of breath and not vomiting     Past Medical History  Diagnosis Date  . Hypertension    Past Surgical History  Procedure Laterality Date  . Cholecystectomy     Family History  Problem Relation Age of Onset  . Diabetes Mother   . Kidney disease Mother   . Alcohol abuse Father   . Diabetes Sister   . Heart disease Sister   . Mental retardation Sister    History  Substance Use Topics  . Smoking status: Never Smoker   . Smokeless tobacco: Never Used  . Alcohol Use: No   OB History   Grav Para Term Preterm Abortions TAB SAB Ect Mult Living                 Review of Systems  Constitutional: Negative for fever.  Respiratory: Negative for cough and shortness of breath.   Cardiovascular: Positive for chest pain. Negative for leg swelling.  Gastrointestinal: Negative for vomiting and abdominal pain.  All other systems reviewed and are negative.    Allergies  Review of patient's allergies indicates no known allergies.  Home Medications   Current Outpatient Rx  Name  Route  Sig  Dispense  Refill  .  acetaminophen (TYLENOL) 500 MG tablet   Oral   Take 500-1,000 mg by mouth every 6 (six) hours as needed for pain.         Marland Kitchen HYDROcodone-acetaminophen (NORCO/VICODIN) 5-325 MG per tablet   Oral   Take 1 tablet by mouth every 6 (six) hours as needed for pain.         Marland Kitchen lisinopril-hydrochlorothiazide (PRINZIDE,ZESTORETIC) 10-12.5 MG per tablet   Oral   Take 1 tablet by mouth every morning.         . traMADol (ULTRAM) 50 MG tablet   Oral   Take 1 tablet (50 mg total) by mouth every 8 (eight) hours as needed for pain.   30 tablet   0    BP 112/61  Pulse 95  Temp(Src) 98.2 F (36.8 C) (Oral)  Resp 19  SpO2 95%  LMP 12/25/2012 Physical Exam  Nursing note and vitals reviewed. Constitutional: She is oriented to person, place, and time. She appears well-developed and well-nourished. No distress.  HENT:  Head: Normocephalic and atraumatic.  Eyes: EOM are normal. Pupils are equal, round, and reactive to light.  Neck: Normal range of motion. Neck supple.  Cardiovascular: Normal rate and regular rhythm.  Exam reveals no friction rub.   No murmur heard. Pulmonary/Chest: Effort normal and breath sounds normal. No respiratory distress. She has no  wheezes. She has no rales. She exhibits tenderness (L upper chest wall).  L axillary tenderness No lymphadenopathy that is appreciable  Abdominal: Soft. She exhibits no distension. There is no tenderness. There is no rebound.  Musculoskeletal: Normal range of motion. She exhibits no edema.       Cervical back: She exhibits no bony tenderness.       Thoracic back: She exhibits tenderness (L paraspinal muscles). She exhibits no bony tenderness.  Neurological: She is alert and oriented to person, place, and time.  Skin: She is not diaphoretic.    ED Course  Procedures (including critical care time) Labs Review Labs Reviewed  POCT I-STAT TROPONIN I   Imaging Review Dg Chest 2 View  12/31/2012   CLINICAL DATA:  Left chest pain and  cough  EXAM: CHEST  2 VIEW  COMPARISON:  None.  FINDINGS: Normal heart size. Clear lungs. No pleural effusion or pneumothorax.  IMPRESSION: No active cardiopulmonary disease.   Electronically Signed   By: Maryclare Bean M.D.   On: 12/31/2012 16:27    EKG Interpretation     Ventricular Rate:  89 PR Interval:  167 QRS Duration: 81 QT Interval:  350 QTC Calculation: 426 R Axis:   68 Text Interpretation:  Sinus rhythm No significant change since last tracing            MDM   1. Chest pain    37 year old female presents with left-sided chest pain. It happens about once daily. Sometimes dull and sometimes sharp. Is underneath her left breast, underneath her left axilla and in her left back. She has been seen for this before and was diagnosed with a muscle strain. She does not feel as a muscle strain. She denies fevers, shortness of breath, pleuritic pain, nausea, vomiting. She denies any recent illness or other injury. On exam, she has stable vitals. She is morbidly obese. She has no lymphadenopathy I can appreciate and her axilla. She does have some tenderness of her left upper chest, left axilla, and left paraspinal muscles. I also feel this is musculoskeletal. Her EKG is normal and similar previous. We'll check a chest x-ray. I do not feel that her pain is consistent with ACS. She is PERC negative. I will give her a tramadol for pain.  CXR normal. Troponin normal. Stable for discharge.    Dagmar Hait, MD 12/31/12 2328

## 2012-12-31 NOTE — ED Notes (Signed)
Pt states that she has been having lt sided chest pain that radiates underneath her left arm to her mid back.  States she has already been seen for this and they told her it was a pulled muscle.  States it "can't be a pulled muscle because it hasn't gotten any better.  No SOB.  No NVD.

## 2012-12-31 NOTE — Progress Notes (Signed)
[facility] provided pt with a [facility] Orange Card application and set up an apt for Eligibility/ Enrollment at [facility] on 11/12.

## 2014-01-22 ENCOUNTER — Emergency Department (HOSPITAL_COMMUNITY): Payer: Self-pay

## 2014-01-22 ENCOUNTER — Emergency Department (HOSPITAL_COMMUNITY)
Admission: EM | Admit: 2014-01-22 | Discharge: 2014-01-22 | Disposition: A | Discharge status: Home / Self Care | Payer: Self-pay | Attending: Emergency Medicine | Admitting: Emergency Medicine

## 2014-01-22 ENCOUNTER — Encounter (HOSPITAL_COMMUNITY): Payer: Self-pay | Admitting: Emergency Medicine

## 2014-01-22 DIAGNOSIS — R0789 Other chest pain: Secondary | ICD-10-CM | POA: Insufficient documentation

## 2014-01-22 DIAGNOSIS — I10 Essential (primary) hypertension: Secondary | ICD-10-CM | POA: Insufficient documentation

## 2014-01-22 DIAGNOSIS — R51 Headache: Secondary | ICD-10-CM | POA: Insufficient documentation

## 2014-01-22 DIAGNOSIS — E079 Disorder of thyroid, unspecified: Secondary | ICD-10-CM | POA: Insufficient documentation

## 2014-01-22 DIAGNOSIS — Z79899 Other long term (current) drug therapy: Secondary | ICD-10-CM | POA: Insufficient documentation

## 2014-01-22 DIAGNOSIS — R079 Chest pain, unspecified: Secondary | ICD-10-CM

## 2014-01-22 DIAGNOSIS — Z3202 Encounter for pregnancy test, result negative: Secondary | ICD-10-CM | POA: Insufficient documentation

## 2014-01-22 HISTORY — DX: Disorder of thyroid, unspecified: E07.9

## 2014-01-22 LAB — BASIC METABOLIC PANEL
Anion gap: 10 (ref 5–15)
BUN: 12 mg/dL (ref 6–23)
CHLORIDE: 102 meq/L (ref 96–112)
CO2: 27 meq/L (ref 19–32)
CREATININE: 0.62 mg/dL (ref 0.50–1.10)
Calcium: 9.4 mg/dL (ref 8.4–10.5)
GFR calc Af Amer: >90 mL/min (ref >90)
GFR calc non Af Amer: >90 mL/min (ref >90)
Glucose, Bld: 84 mg/dL (ref 70–99)
Potassium: 4.2 mEq/L (ref 3.7–5.3)
Sodium: 139 mEq/L (ref 137–147)

## 2014-01-22 LAB — CBC
HEMATOCRIT: 40.5 % (ref 36.0–46.0)
Hemoglobin: 13.2 g/dL (ref 12.0–15.0)
MCH: 27 pg (ref 26.0–34.0)
MCHC: 32.6 g/dL (ref 30.0–36.0)
MCV: 82.8 fL (ref 78.0–100.0)
Platelets: 308 10*3/uL (ref 150–400)
RBC: 4.89 MIL/uL (ref 3.87–5.11)
RDW: 13.4 % (ref 11.5–15.5)
WBC: 8.7 10*3/uL (ref 4.0–10.5)

## 2014-01-22 LAB — POC URINE PREG, ED: PREG TEST UR: NEGATIVE

## 2014-01-22 LAB — I-STAT TROPONIN, ED: Troponin i, poc: 0 ng/mL (ref 0.00–0.08)

## 2014-01-22 MED ORDER — ASPIRIN 325 MG PO TABS
325.0000 mg | ORAL_TABLET | ORAL | Status: DC
Start: 2014-01-22 — End: 2014-01-22

## 2014-01-22 NOTE — Discharge Instructions (Signed)

## 2014-01-22 NOTE — ED Provider Notes (Signed)
CSN: [id number]     Arrival date & time 01/22/14  1411 History   First MD Initiated Contact with Patient 01/22/14 1556     Chief Complaint  Patient presents with  . Chest Pain  . Headache     (Consider location/radiation/quality/duration/timing/severity/associated sxs/prior Treatment) Patient is a 38 y.o. female presenting with chest pain. The history is provided by the patient. No language interpreter was used.  Chest Pain Pain location:  L chest Pain quality: sharp   Pain radiates to:  Does not radiate Pain radiates to the back: no   Pain severity:  Moderate Onset quality:  Sudden Duration: seconds. Timing:  Intermittent Progression:  Waxing and waning Chronicity:  Chronic (about one year) Context: at rest   Relieved by:  Nothing Worsened by:  Nothing tried Ineffective treatments:  None tried Associated symptoms: headache   Associated symptoms: no abdominal pain, no back pain, no cough, no diaphoresis, no fatigue, no fever, no lower extremity edema, no nausea, no numbness, no palpitations, no shortness of breath, no syncope, not vomiting and no weakness   Risk factors: hypertension and obesity   Risk factors: no aortic disease, no birth control, no coronary artery disease, no diabetes mellitus, no immobilization, not pregnant, no prior DVT/PE, no smoking and no surgery     Past Medical History  Diagnosis Date  . Hypertension   . Thyroid disease    Past Surgical History  Procedure Laterality Date  . Cholecystectomy     Family History  Problem Relation Age of Onset  . Diabetes Mother   . Kidney disease Mother   . Alcohol abuse Father   . Diabetes Sister   . Heart disease Sister   . Mental retardation Sister    History  Substance Use Topics  . Smoking status: Never Smoker   . Smokeless tobacco: Never Used  . Alcohol Use: No   OB History    No data available     Review of Systems  Constitutional: Negative for fever, chills, diaphoresis, activity change,  appetite change and fatigue.  HENT: Negative for congestion, facial swelling, rhinorrhea and sore throat.   Eyes: Negative for photophobia and discharge.  Respiratory: Negative for cough, chest tightness and shortness of breath.   Cardiovascular: Positive for chest pain. Negative for palpitations, leg swelling and syncope.  Gastrointestinal: Negative for nausea, vomiting, abdominal pain and diarrhea.  Endocrine: Negative for polydipsia and polyuria.  Genitourinary: Negative for dysuria, frequency, difficulty urinating and pelvic pain.  Musculoskeletal: Negative for back pain, arthralgias, neck pain and neck stiffness.  Skin: Negative for color change and wound.  Allergic/Immunologic: Negative for immunocompromised state.  Neurological: Positive for headaches. Negative for facial asymmetry, weakness and numbness.  Hematological: Does not bruise/bleed easily.  Psychiatric/Behavioral: Negative for confusion and agitation.      Allergies  Review of patient's allergies indicates no known allergies.  Home Medications   Prior to Admission medications   Medication Sig Start Date End Date Taking? Authorizing Provider  acetaminophen (TYLENOL) 500 MG tablet Take 500-1,000 mg by mouth every 6 (six) hours as needed for pain.   Yes Historical Provider, MD  levothyroxine (SYNTHROID, LEVOTHROID) 25 MCG tablet Take 25 mcg by mouth daily before breakfast.   Yes Historical Provider, MD  traMADol (ULTRAM) 50 MG tablet Take 1 tablet (50 mg total) by mouth every 8 (eight) hours as needed for pain. Patient not taking: Reported on 01/22/2014 12/31/12   Elwin MochaBlair Walden, MD   BP 144/95 mmHg  Pulse 85  Temp(Src) 97.8 F (36.6 C) (Oral)  Resp 18  SpO2 100%  LMP 11/20/2013 Physical Exam  Constitutional: She is oriented to person, place, and time. She appears well-developed and well-nourished. No distress.  HENT:  Head: Normocephalic and atraumatic.  Mouth/Throat: No oropharyngeal exudate.  Eyes: Pupils are  equal, round, and reactive to light.  Neck: Normal range of motion. Neck supple.  Cardiovascular: Normal rate, regular rhythm and normal heart sounds.  Exam reveals no gallop and no friction rub.   No murmur heard. Pulmonary/Chest: Effort normal and breath sounds normal. No respiratory distress. She has no wheezes. She has no rales.    Abdominal: Soft. Bowel sounds are normal. She exhibits no distension and no mass. There is no tenderness. There is no rebound and no guarding.  Musculoskeletal: Normal range of motion. She exhibits no edema or tenderness.  Neurological: She is alert and oriented to person, place, and time.  Skin: Skin is warm and dry.  Psychiatric: She has a normal mood and affect.    ED Course  Procedures (including critical care time) Labs Review Labs Reviewed  CBC  BASIC METABOLIC PANEL  I-STAT TROPOININ, ED  POC URINE PREG, ED    Imaging Review Dg Chest 2 View  01/22/2014   CLINICAL DATA:  Left anterior an axillary chest pain began 4 days ago. No other complaints.  EXAM: CHEST  2 VIEW  COMPARISON:  12/31/2012  FINDINGS: The heart size and mediastinal contours are within normal limits. Both lungs are clear. The visualized skeletal structures are unremarkable.  IMPRESSION: No active cardiopulmonary disease.   Electronically Signed   By: Elige KoHetal  Patel   On: 01/22/2014 15:09     EKG Interpretation   Date/Time:  Wednesday January 22 2014 14:23:07 EST Ventricular Rate:  86 PR Interval:  168 QRS Duration: 81 QT Interval:  357 QTC Calculation: 427 R Axis:   47 Text Interpretation:  Sinus rhythm Probable left atrial enlargement No  significant change since last tracing Confirmed by DOCHERTY  MD, MEGAN  ([id number]) on 01/22/2014 3:58:31 PM      MDM   Final diagnoses:  Left-sided chest wall pain    Pt is a 38 y.o. female with Pmhx as above who presents with intermittent left-sided sharp chest pain for about one year.  She states pain is in the left upper chest  and left axilla.  It has no exacerbating or alleviating symptoms.  No aggravating symptoms.  In no associated symptoms.  She was seen previously about a year ago for the same.  Pain today with worse prompting her to come to the ED.  On physical exam vital signs are stable and she is in no acute distress.  Chest pain is reproducible, crit pulmonary exam is otherwise benign.  No masses palpated on left-sided breast exam. She's no lower extremity pain or swelling.  EKG without acute ischemic changes, chest x-ray normal, troponin is negative.  CBC and BMP are also unremarkable. She's PERC negative.  Only risk factor for ACS is hypertension.  Symptoms not consistent with ACS.  Also doubt PE.  I have requested that she follow-up with her PCP for continued symptoms. Have encouraged trial of NSAIDs.       Toy CookeyMegan Docherty, MD 01/22/14 [phone]

## 2014-01-22 NOTE — ED Notes (Signed)
Pt c/o left chest pain that goes under her left armpit that is intermittent that started on Sunday.  Pt states that she has something "squishy under my left armpit that has been there and i have told people about it for year now, but im told just a lymph node".  Pt denies shob, n/v, dizzy/lightheaded.

## 2014-04-17 ENCOUNTER — Encounter (HOSPITAL_COMMUNITY): Payer: Self-pay | Admitting: Emergency Medicine

## 2014-04-17 ENCOUNTER — Emergency Department (HOSPITAL_COMMUNITY)
Admission: EM | Admit: 2014-04-17 | Discharge: 2014-04-18 | Disposition: A | Discharge status: Home / Self Care | Payer: Self-pay | Attending: Emergency Medicine | Admitting: Emergency Medicine

## 2014-04-17 DIAGNOSIS — Z9049 Acquired absence of other specified parts of digestive tract: Secondary | ICD-10-CM | POA: Insufficient documentation

## 2014-04-17 DIAGNOSIS — Z791 Long term (current) use of non-steroidal anti-inflammatories (NSAID): Secondary | ICD-10-CM | POA: Insufficient documentation

## 2014-04-17 DIAGNOSIS — R079 Chest pain, unspecified: Secondary | ICD-10-CM | POA: Insufficient documentation

## 2014-04-17 DIAGNOSIS — Z8639 Personal history of other endocrine, nutritional and metabolic disease: Secondary | ICD-10-CM | POA: Insufficient documentation

## 2014-04-17 DIAGNOSIS — I10 Essential (primary) hypertension: Secondary | ICD-10-CM | POA: Insufficient documentation

## 2014-04-17 LAB — POC URINE PREG, ED: Preg Test, Ur: NEGATIVE

## 2014-04-17 NOTE — ED Notes (Signed)
Pt reports her chest feels full on the left side. Pt states her hands were cramping. Pt is alert and oriented. States she feels SOB.

## 2014-04-18 ENCOUNTER — Emergency Department (HOSPITAL_COMMUNITY): Payer: Self-pay

## 2014-04-18 LAB — BASIC METABOLIC PANEL
Anion gap: 6 (ref 5–15)
BUN: 17 mg/dL (ref 6–23)
CO2: 28 mmol/L (ref 19–32)
Calcium: 8.9 mg/dL (ref 8.4–10.5)
Chloride: 102 mmol/L (ref 96–112)
Creatinine, Ser: 0.65 mg/dL (ref 0.50–1.10)
GFR calc non Af Amer: >90 mL/min (ref >90)
GLUCOSE: 155 mg/dL — AB (ref 70–99)
POTASSIUM: 3.7 mmol/L (ref 3.5–5.1)
Sodium: 136 mmol/L (ref 135–145)

## 2014-04-18 LAB — CBC
HCT: 40.8 % (ref 36.0–46.0)
Hemoglobin: 13.3 g/dL (ref 12.0–15.0)
MCH: 27.1 pg (ref 26.0–34.0)
MCHC: 32.6 g/dL (ref 30.0–36.0)
MCV: 83.3 fL (ref 78.0–100.0)
Platelets: 279 10*3/uL (ref 150–400)
RBC: 4.9 MIL/uL (ref 3.87–5.11)
RDW: 13.4 % (ref 11.5–15.5)
WBC: 7.7 10*3/uL (ref 4.0–10.5)

## 2014-04-18 LAB — I-STAT TROPONIN, ED: Troponin i, poc: 0 ng/mL (ref 0.00–0.08)

## 2014-04-18 MED ORDER — OMEPRAZOLE 20 MG PO CPDR: 20.0000 mg | DELAYED_RELEASE_CAPSULE | Freq: Every day | ORAL | Status: AC

## 2014-04-18 MED ORDER — GI COCKTAIL ~~LOC~~
30.0000 mL | Freq: Once | ORAL | Status: AC
  Administered 2014-04-18: 30 mL via ORAL
  Filled 2014-04-18: qty 30

## 2014-04-18 NOTE — ED Provider Notes (Signed)
CSN: [id number]     Arrival date & time 04/17/14  2247 History   First MD Initiated Contact with Patient 04/17/14 2317     Chief Complaint  Patient presents with  . Shortness of Breath     HPI Patient states that she's had a fullness and heaviness in her chest over the past 2 days.  This evening she states that she sort of acutely felt her hands began to tingle and cramp and felt short of breath.  She checked her pulse rate home and was elevated.  She does have a history of gastroesophageal reflux disease but is no longer taking Prilosec.  She states that occasionally she will try Rolaids and this helps.  She denies abdominal pain.  She is status post cholecystectomy.  She has a history of hypertension.  She does not smoke cigarettes.  Currently her shortness of breath is resolved and she is no longer cramping in her hands.  She is feeling better at this time.  She still has a mild amount of "heaviness" in her chest.   Past Medical History  Diagnosis Date  . Hypertension   . Thyroid disease    Past Surgical History  Procedure Laterality Date  . Cholecystectomy     Family History  Problem Relation Age of Onset  . Diabetes Mother   . Kidney disease Mother   . Alcohol abuse Father   . Diabetes Sister   . Heart disease Sister   . Mental retardation Sister    History  Substance Use Topics  . Smoking status: Never Smoker   . Smokeless tobacco: Never Used  . Alcohol Use: No   OB History    No data available     Review of Systems  All other systems reviewed and are negative.     Allergies  Review of patient's allergies indicates no known allergies.  Home Medications   Prior to Admission medications   Medication Sig Start Date End Date Taking? Authorizing Provider  acetaminophen (TYLENOL) 500 MG tablet Take 500-1,000 mg by mouth every 6 (six) hours as needed for pain.   Yes Historical Provider, MD  naproxen sodium (ANAPROX) 220 MG tablet Take 220 mg by mouth 2 (two) times  daily with a meal.   Yes Historical Provider, MD  omeprazole (PRILOSEC) 20 MG capsule Take 1 capsule (20 mg total) by mouth daily. 04/18/14   Lyanne CoKevin M Lucan Riner, MD  traMADol (ULTRAM) 50 MG tablet Take 1 tablet (50 mg total) by mouth every 8 (eight) hours as needed for pain. Patient not taking: Reported on 01/22/2014 12/31/12   Elwin MochaBlair Walden, MD   BP 128/70 mmHg  Pulse 83  Temp(Src) 97.7 F (36.5 C) (Oral)  Resp 70  SpO2 100%  LMP 12/15/2013 Physical Exam  Constitutional: She is oriented to person, place, and time. She appears well-developed and well-nourished. No distress.  HENT:  Head: Normocephalic and atraumatic.  Eyes: EOM are normal.  Neck: Normal range of motion.  Cardiovascular: Normal rate, regular rhythm and normal heart sounds.   Pulmonary/Chest: Effort normal and breath sounds normal.  Abdominal: Soft. She exhibits no distension. There is no tenderness.  Musculoskeletal: Normal range of motion.  Neurological: She is alert and oriented to person, place, and time.  Skin: Skin is warm and dry.  Psychiatric: She has a normal mood and affect. Judgment normal.  Nursing note and vitals reviewed.   ED Course  Procedures (including critical care time) Labs Review Labs Reviewed  BASIC METABOLIC PANEL -  Abnormal; Notable for the following:    Glucose, Bld 155 (*)    All other components within normal limits  CBC  I-STAT TROPOININ, ED  POC URINE PREG, ED    Imaging Review Dg Chest 2 View  04/18/2014   CLINICAL DATA:  Acute onset of shortness of breath this morning, chest heaviness.  EXAM: CHEST  2 VIEW  COMPARISON:  Chest radiograph January 22, 2014  FINDINGS: Cardiomediastinal silhouette is unremarkable. The lungs are clear without pleural effusions or focal consolidations. Trachea projects midline and there is no pneumothorax. Soft tissue planes and included osseous structures are non-suspicious. Large body habitus.  IMPRESSION: No acute cardiopulmonary process ; stable chest  radiograph from January 22, 2014.   Electronically Signed   By: Awilda Metro   On: 04/18/2014 01:15     EKG Interpretation   Date/Time:  Thursday April 17 2014 23:24:27 EST Ventricular Rate:  78 PR Interval:  181 QRS Duration: 87 QT Interval:  369 QTC Calculation: 420 R Axis:   55 Text Interpretation:  Sinus rhythm No significant change was found  Confirmed by Jasani Dolney  MD, Osias Resnick ([id number]) on 04/18/2014 12:38:39 AM      MDM   Final diagnoses:  Chest pain, unspecified chest pain type    My suspicion for ACS is low.  She's had constant pressure in her chest.  Her EKG is normal.  Her troponin is negative.  She seems to be feeling slightly better after the GI cocktail.  I suspect this is more GI related.  Patient be started back on her Prilosec.  Recommended that she follow-up with primary care physician.  She understands to return to the ER for new or worsening symptoms.    Lyanne Co, MD 04/18/14 [phone]

## 2014-04-18 NOTE — Discharge Instructions (Signed)

## 2014-08-11 ENCOUNTER — Emergency Department (HOSPITAL_COMMUNITY): Payer: Self-pay

## 2014-08-11 ENCOUNTER — Emergency Department (HOSPITAL_COMMUNITY)
Admission: EM | Admit: 2014-08-11 | Discharge: 2014-08-12 | Disposition: A | Discharge status: Home / Self Care | Payer: Self-pay | Attending: Emergency Medicine | Admitting: Emergency Medicine

## 2014-08-11 ENCOUNTER — Encounter (HOSPITAL_COMMUNITY): Payer: Self-pay | Admitting: Emergency Medicine

## 2014-08-11 DIAGNOSIS — R1084 Generalized abdominal pain: Secondary | ICD-10-CM | POA: Insufficient documentation

## 2014-08-11 DIAGNOSIS — I10 Essential (primary) hypertension: Secondary | ICD-10-CM | POA: Insufficient documentation

## 2014-08-11 DIAGNOSIS — M549 Dorsalgia, unspecified: Secondary | ICD-10-CM | POA: Insufficient documentation

## 2014-08-11 DIAGNOSIS — R109 Unspecified abdominal pain: Secondary | ICD-10-CM

## 2014-08-11 DIAGNOSIS — Z3202 Encounter for pregnancy test, result negative: Secondary | ICD-10-CM | POA: Insufficient documentation

## 2014-08-11 DIAGNOSIS — R0789 Other chest pain: Secondary | ICD-10-CM | POA: Insufficient documentation

## 2014-08-11 DIAGNOSIS — R05 Cough: Secondary | ICD-10-CM | POA: Insufficient documentation

## 2014-08-11 DIAGNOSIS — R079 Chest pain, unspecified: Secondary | ICD-10-CM

## 2014-08-11 DIAGNOSIS — Z791 Long term (current) use of non-steroidal anti-inflammatories (NSAID): Secondary | ICD-10-CM | POA: Insufficient documentation

## 2014-08-11 DIAGNOSIS — Z79899 Other long term (current) drug therapy: Secondary | ICD-10-CM | POA: Insufficient documentation

## 2014-08-11 LAB — URINALYSIS, ROUTINE W REFLEX MICROSCOPIC
Bilirubin Urine: NEGATIVE
GLUCOSE, UA: NEGATIVE mg/dL
Hgb urine dipstick: NEGATIVE
Ketones, ur: NEGATIVE mg/dL
Leukocytes, UA: NEGATIVE
Nitrite: NEGATIVE
Protein, ur: NEGATIVE mg/dL
Specific Gravity, Urine: 1.015 (ref 1.005–1.030)
Urobilinogen, UA: 1 mg/dL (ref 0.0–1.0)
pH: 6.5 (ref 5.0–8.0)

## 2014-08-11 LAB — COMPREHENSIVE METABOLIC PANEL
ALBUMIN: 3.7 g/dL (ref 3.5–5.0)
ALT: 25 U/L (ref 14–54)
AST: 23 U/L (ref 15–41)
Alkaline Phosphatase: 85 U/L (ref 38–126)
Anion gap: 9 (ref 5–15)
BILIRUBIN TOTAL: 0.2 mg/dL — AB (ref 0.3–1.2)
BUN: 8 mg/dL (ref 6–20)
CALCIUM: 9.3 mg/dL (ref 8.9–10.3)
CHLORIDE: 102 mmol/L (ref 101–111)
CO2: 28 mmol/L (ref 22–32)
Creatinine, Ser: 0.8 mg/dL (ref 0.44–1.00)
GFR calc non Af Amer: >60 mL/min (ref >60)
Glucose, Bld: 95 mg/dL (ref 65–99)
Potassium: 3.9 mmol/L (ref 3.5–5.1)
SODIUM: 139 mmol/L (ref 135–145)
TOTAL PROTEIN: 7.5 g/dL (ref 6.5–8.1)

## 2014-08-11 LAB — CBC WITH DIFFERENTIAL/PLATELET
BASOS PCT: 1 % (ref 0–1)
Basophils Absolute: 0 10*3/uL (ref 0.0–0.1)
Eosinophils Absolute: 0.2 10*3/uL (ref 0.0–0.7)
Eosinophils Relative: 3 % (ref 0–5)
HCT: 41.8 % (ref 36.0–46.0)
HEMOGLOBIN: 13.6 g/dL (ref 12.0–15.0)
LYMPHS ABS: 3.5 10*3/uL (ref 0.7–4.0)
Lymphocytes Relative: 45 % (ref 12–46)
MCH: 26.7 pg (ref 26.0–34.0)
MCHC: 32.5 g/dL (ref 30.0–36.0)
MCV: 82 fL (ref 78.0–100.0)
MONO ABS: 0.5 10*3/uL (ref 0.1–1.0)
Monocytes Relative: 6 % (ref 3–12)
NEUTROS ABS: 3.7 10*3/uL (ref 1.7–7.7)
Neutrophils Relative %: 47 % (ref 43–77)
Platelets: 306 10*3/uL (ref 150–400)
RBC: 5.1 MIL/uL (ref 3.87–5.11)
RDW: 13.1 % (ref 11.5–15.5)
WBC: 7.9 10*3/uL (ref 4.0–10.5)

## 2014-08-11 LAB — LIPASE, BLOOD: Lipase: 26 U/L (ref 22–51)

## 2014-08-11 LAB — I-STAT TROPONIN, ED: TROPONIN I, POC: 0 ng/mL (ref 0.00–0.08)

## 2014-08-11 LAB — PREGNANCY, URINE: Preg Test, Ur: NEGATIVE

## 2014-08-11 MED ORDER — IOHEXOL 300 MG/ML  SOLN
100.0000 mL | Freq: Once | INTRAMUSCULAR | Status: AC | PRN
  Administered 2014-08-11: 100 mL via INTRAVENOUS

## 2014-08-11 MED ORDER — ONDANSETRON 8 MG PO TBDP: ORAL_TABLET | ORAL | Status: DC

## 2014-08-11 MED ORDER — SODIUM CHLORIDE 0.9 % IV BOLUS (SEPSIS)
1000.0000 mL | Freq: Once | INTRAVENOUS | Status: AC
  Administered 2014-08-11: 1000 mL via INTRAVENOUS

## 2014-08-11 MED ORDER — HYDROCODONE-ACETAMINOPHEN 5-325 MG PO TABS: 1.0000 | ORAL_TABLET | Freq: Four times a day (QID) | ORAL | Status: DC | PRN

## 2014-08-11 MED ORDER — GI COCKTAIL ~~LOC~~
30.0000 mL | Freq: Once | ORAL | Status: AC
  Administered 2014-08-11: 30 mL via ORAL
  Filled 2014-08-11: qty 30

## 2014-08-11 MED ORDER — OXYCODONE-ACETAMINOPHEN 5-325 MG PO TABS
2.0000 | ORAL_TABLET | Freq: Once | ORAL | Status: AC
  Administered 2014-08-11: 2 via ORAL
  Filled 2014-08-11: qty 2

## 2014-08-11 NOTE — ED Notes (Signed)
Pt c/o abdominal pain x 1 week, chest, flank pain onset Thursday. Pt denies n/v/diarrhea or dysuria currently, c/o diarrhea last week.

## 2014-08-11 NOTE — ED Provider Notes (Signed)
CSN: [id number]     Arrival date & time 08/11/14  1527 History   First MD Initiated Contact with Patient 08/11/14 1847     Chief Complaint  Patient presents with  . Chest Pain  . Flank Pain     (Consider location/radiation/quality/duration/timing/severity/associated sxs/prior Treatment) The history is provided by the patient and medical records. No language interpreter was used.     Alexandra Frank is a 39 y.o. female  with a hx of HTN, thyroid disease, IBS presents to the Emergency Department complaining of gradual, persistent, progressively worsening left sided chest pain and pressure onset 4 days ago.  Pt reports the pain moved to her left upper back, left flank, left lower and then radiated across her upper abdomen.  Pt describes the pain as blunt in the back and side and chest pain as pressure.  Pt reports associated abdominal bloating.  Pt rates her pain at a 7/10.  Pt reports taking OTC Gas-X, antacids without relief.  Pt reports last week she had a URI with rhinorrhea, cough, sore throat.  Pt reports resolution of all symptoms including cough.  Pt reports some intermittent "stomach problems" in the last few weeks which she described as generalized abdominal cramps and diarrhea.  This was present 1 week ago and it resolved.  Pt reports previous Hx of cholecystectomy but no other abdominal surgeries.  Pt denies fever, chills, headache, neck pain, SOB, weakness, dizziness, syncope, dysuria.  Pt reports occasional EtOH usage, but denies illicit drug usage. She denies smoking cigarettes. Pt reports taking Bayer ASA 1-2 tablets per day for the last 2 weeks.  She reports intermittent usage of NSAIDs but nothing on a regular basis.  Pt reports no aggravating or alleviating factors.  Pt denies aggravation or relief with eating.     Past Medical History  Diagnosis Date  . Hypertension   . Thyroid disease    Past Surgical History  Procedure Laterality Date  . Cholecystectomy     Family History   Problem Relation Age of Onset  . Diabetes Mother   . Kidney disease Mother   . Alcohol abuse Father   . Diabetes Sister   . Heart disease Sister   . Mental retardation Sister    History  Substance Use Topics  . Smoking status: Never Smoker   . Smokeless tobacco: Never Used  . Alcohol Use: No   OB History    No data available     Review of Systems  Constitutional: Negative for fever, diaphoresis, appetite change, fatigue and unexpected weight change.  HENT: Negative for mouth sores.   Eyes: Negative for visual disturbance.  Respiratory: Positive for cough. Negative for chest tightness, shortness of breath and wheezing.   Cardiovascular: Positive for chest pain.  Gastrointestinal: Positive for abdominal pain. Negative for nausea, vomiting, diarrhea and constipation.  Endocrine: Negative for polydipsia, polyphagia and polyuria.  Genitourinary: Positive for flank pain. Negative for dysuria, urgency, frequency and hematuria.  Musculoskeletal: Positive for back pain. Negative for neck stiffness.  Skin: Negative for rash.  Allergic/Immunologic: Negative for immunocompromised state.  Neurological: Negative for syncope, light-headedness and headaches.  Hematological: Does not bruise/bleed easily.  Psychiatric/Behavioral: Negative for sleep disturbance. The patient is not nervous/anxious.       Allergies  Review of patient's allergies indicates no known allergies.  Home Medications   Prior to Admission medications   Medication Sig Start Date End Date Taking? Authorizing Provider  acetaminophen (TYLENOL) 500 MG tablet Take 500-1,000 mg by mouth every  6 (six) hours as needed for pain.   Yes Historical Provider, MD  BAYER ASPIRIN PO Take 1-2 tablets by mouth daily as needed (For pain).   Yes Historical Provider, MD  naproxen sodium (ANAPROX) 220 MG tablet Take 220 mg by mouth 2 (two) times daily with a meal.    Yes Historical Provider, MD  OVER THE COUNTER MEDICATION Take 1 tablet  by mouth. Medication to reduce flatulence   Yes Historical Provider, MD  HYDROcodone-acetaminophen (NORCO/VICODIN) 5-325 MG per tablet Take 1-2 tablets by mouth every 6 (six) hours as needed for moderate pain or severe pain. 08/11/14   Tevon Berhane, PA-C  omeprazole (PRILOSEC) 20 MG capsule Take 1 capsule (20 mg total) by mouth daily. Patient not taking: Reported on 08/11/2014 04/18/14   Azalia Bilis, MD  ondansetron [facility] ODT) 8 MG disintegrating tablet  ODT q4 hours prn nausea 08/11/14   Dahlia Client Zenaida Tesar, PA-C  traMADol (ULTRAM) 50 MG tablet Take 1 tablet (50 mg total) by mouth every 8 (eight) hours as needed for pain. Patient not taking: Reported on 01/22/2014 12/31/12   Elwin Mocha, MD   BP 156/88 mmHg  Pulse 68  Temp(Src) 97.6 F (36.4 C) (Oral)  Resp 16  SpO2 97% Physical Exam  Constitutional: She appears well-developed and well-nourished. No distress.  Awake, alert, nontoxic appearance  HENT:  Head: Normocephalic and atraumatic.  Mouth/Throat: Oropharynx is clear and moist. No oropharyngeal exudate.  Eyes: Conjunctivae are normal. No scleral icterus.  Neck: Normal range of motion. Neck supple.  Full ROM without pain  Cardiovascular: Normal rate, regular rhythm, normal heart sounds and intact distal pulses.   No murmur heard. Pulmonary/Chest: Effort normal and breath sounds normal. No respiratory distress. She has no wheezes.  Equal chest expansion  Abdominal: Soft. Bowel sounds are normal. She exhibits no distension and no mass. There is generalized tenderness. There is guarding and CVA tenderness (mild, left). There is no rebound.  Generalized abdominal tenderness with guarding throughout Mild left CVA tenderness Obese abdomen  Musculoskeletal: Normal range of motion. She exhibits no edema.  Full range of motion of the T-spine and L-spine No tenderness to palpation of the spinous processes of the T-spine or L-spine No tenderness to palpation of the paraspinous  muscles of the L-spine  Lymphadenopathy:    She has no cervical adenopathy.  Neurological: She is alert. She has normal reflexes.  Reflex Scores:      Bicep reflexes are 2+ on the right side and 2+ on the left side.      Brachioradialis reflexes are 2+ on the right side and 2+ on the left side.      Patellar reflexes are 2+ on the right side and 2+ on the left side.      Achilles reflexes are 2+ on the right side and 2+ on the left side. Speech is clear and goal oriented, follows commands Normal 5/5 strength in upper and lower extremities bilaterally including dorsiflexion and plantar flexion, strong and equal grip strength Sensation normal to light and sharp touch Moves extremities without ataxia, coordination intact Normal gait Normal balance No Clonus   Skin: Skin is warm and dry. No rash noted. She is not diaphoretic. No erythema.  Psychiatric: She has a normal mood and affect. Her behavior is normal.  Nursing note and vitals reviewed.   ED Course  Procedures (including critical care time) Labs Review Labs Reviewed  COMPREHENSIVE METABOLIC PANEL - Abnormal; Notable for the following:    Total Bilirubin 0.2 (*)  All other components within normal limits  URINALYSIS, ROUTINE W REFLEX MICROSCOPIC (NOT [facility]) - Abnormal; Notable for the following:    APPearance CLOUDY (*)    All other components within normal limits  CBC WITH DIFFERENTIAL/PLATELET  LIPASE, BLOOD  PREGNANCY, URINE  POC URINE PREG, ED  I-STAT TROPOININ, ED    Imaging Review Dg Chest 2 View  08/11/2014   CLINICAL DATA:  Chest pain, body aches for 1 week  EXAM: CHEST  2 VIEW  COMPARISON:  04/18/2014  FINDINGS: The heart size and mediastinal contours are within normal limits. Both lungs are clear. The visualized skeletal structures are unremarkable.  IMPRESSION: No active cardiopulmonary disease.   Electronically Signed   By: Elige KoHetal  Patel   On: 08/11/2014 19:13   Ct Abdomen Pelvis W Contrast  08/11/2014    CLINICAL DATA:  Initial evaluation for acute upper abdominal pain for 1 week.  EXAM: CT ABDOMEN AND PELVIS WITH CONTRAST  TECHNIQUE: Multidetector CT imaging of the abdomen and pelvis was performed using the standard protocol following bolus administration of intravenous contrast.  CONTRAST:  100mL OMNIPAQUE IOHEXOL 300 MG/ML  SOLN  COMPARISON:  None available.  FINDINGS: 5 mm focus of ground-glass opacity within the right middle lobe noted (series 6, image 4). Finding is indeterminate, but may reflect a small focus of atelectasis or pneumonitis. This is felt to be of scratched doubtful clinical significance. Visualized lungs are otherwise clear.  Two round well-circumscribed hyperdense foci measuring approximately 15-17 mm are located within the superior right hepatic lobe (series 18 2, image 18, 19). These are favored to reflect 2 adjacent lesions rather than a single lobulated lesion. Liver is otherwise unremarkable.  Gallbladder is absent. No biliary dilatation. Spleen, adrenal glands, and pancreas demonstrate a normal contrast enhanced appearance.  The kidneys are equal in size with symmetric enhancement. No nephrolithiasis, hydronephrosis, or focal enhancing renal mass.  Stomach within normal limits. No evidence for bowel obstruction. Appendix well visualized in the right lower quadrant and is of normal caliber and appearance without associated inflammatory changes to suggest acute appendicitis. No abnormal wall thickening, mucosal enhancement, or inflammatory fat stranding seen about the bowels.  Bladder is normal.  Uterus and ovaries unremarkable for patient age.  No free air or fluid. No pathologically enlarged intra-abdominal or pelvic lymph nodes identified. Normal intravascular enhancement seen throughout the intra-abdominal aorta and its branch vessels. Incidental note made of a retro aortic left renal vein.  No acute osseous abnormality. No worrisome lytic or blastic osseous lesions. Degenerative disc  desiccation present at L4-5.  IMPRESSION: 1. No CT evidence for acute intra-abdominal or pelvic process identified. 2. Two adjacent mildly hyperdense lesions within the right hepatic lobe, measuring approximately 1.5 cm each. Findings are indeterminate, and may reflect benign hemangiomas. These are of doubtful clinical significance. Confirmation with dedicated abdominal MRI could be performed as clinically desired. 3. Status post cholecystectomy.   Electronically Signed   By: Rise MuBenjamin  McClintock M.D.   On: 08/11/2014 21:58     EKG Interpretation   Date/Time:  Monday August 11 2014 16:04:57 EDT Ventricular Rate:  70 PR Interval:  163 QRS Duration: 77 QT Interval:  376 QTC Calculation: 406 R Axis:   54 Text Interpretation:  Sinus rhythm Baseline wander in lead(s) II III aVF  since last tracing no significant change Confirmed by Effie ShyWENTZ  MD, ELLIOTT  ([id number]) on 08/12/2014 12:13:49 AM        MDM   Final diagnoses:  Chest pain  Abdominal pain  Flank pain   Elayne Mcmillen presents with left-sided chest pain, abdominal pain, left flank pain ongoing for the past 14 days but worsening today. Patient with guarding on her abdominal exam but nonfocal findings. No rebound or distention.  Labs are reassuring. Urinalysis without evidence of urinary tract infection or hematuria. No leukocytosis.  EKG is nonischemic and chest x-ray is without evidence of pneumonia.  Discussed with patient risks versus benefit of CT scan, reassuring labs and her disc continuing discomfort. Offer CT scan of versus watchful waiting and repeat abdominal exam tomorrow. Patient wishes for CT scan at this time.  11:51 PM Patient CT without acute abnormality. Labs reassuring. EKG without acute abnormalities.  Patient is tolerating by mouth here without difficulty.  She wishes for discharge home. Patient will have follow-up with the [facility] in the next week for further evaluation.  Patient is nontoxic,  nonseptic appearing, in no apparent distress.  Patient's pain and other symptoms adequately managed in emergency department.  Fluid bolus given.  Labs, imaging and vitals reviewed.  Patient does not meet the SIRS or Sepsis criteria.  On repeat exam patient does not have a surgical abdomin and there are no peritoneal signs.  No indication of appendicitis, bowel obstruction, bowel perforation, cholecystitis, diverticulitis, PID or ectopic pregnancy.  Patient discharged home with symptomatic treatment and given strict instructions for follow-up with their primary care physician.  I have also discussed reasons to return immediately to the ER.  Patient expresses understanding and agrees with plan.   BP 156/88 mmHg  Pulse 68  Temp(Src) 97.6 F (36.4 C) (Oral)  Resp 16  SpO2 97%   Dierdre Forth, PA-C 08/12/14 1610  Mancel Bale, MD 08/18/14 1945

## 2014-08-11 NOTE — ED Notes (Signed)
Patient given crackers and water for PO challenge

## 2014-08-11 NOTE — Discharge Instructions (Signed)
1. Medications: zofran, vicodin, usual home medications 2. Treatment: rest, drink plenty of fluids, advance diet slowly 3. Follow Up: Please followup with your primary doctor in 2 days for discussion of your diagnoses and further evaluation after today's visit; if you do not have a primary care doctor use the resource guide provided to find one; Please return to the ER for persistent vomiting, high fevers or worsening symptoms    Abdominal Pain, Women Abdominal (stomach, pelvic, or belly) pain can be caused by many things. It is important to tell your doctor:  The location of the pain.  Does it come and go or is it present all the time?  Are there things that start the pain (eating certain foods, exercise)?  Are there other symptoms associated with the pain (fever, nausea, vomiting, diarrhea)? All of this is helpful to know when trying to find the cause of the pain. CAUSES   Stomach: virus or bacteria infection, or ulcer.  Intestine: appendicitis (inflamed appendix), regional ileitis (Crohn's disease), ulcerative colitis (inflamed colon), irritable bowel syndrome, diverticulitis (inflamed diverticulum of the colon), or cancer of the stomach or intestine.  Gallbladder disease or stones in the gallbladder.  Kidney disease, kidney stones, or infection.  Pancreas infection or cancer.  Fibromyalgia (pain disorder).  Diseases of the female organs:  Uterus: fibroid (non-cancerous) tumors or infection.  Fallopian tubes: infection or tubal pregnancy.  Ovary: cysts or tumors.  Pelvic adhesions (scar tissue).  Endometriosis (uterus lining tissue growing in the pelvis and on the pelvic organs).  Pelvic congestion syndrome (female organs filling up with blood just before the menstrual period).  Pain with the menstrual period.  Pain with ovulation (producing an egg).  Pain with an IUD (intrauterine device, birth control) in the uterus.  Cancer of the female organs.  Functional  pain (pain not caused by a disease, may improve without treatment).  Psychological pain.  Depression. DIAGNOSIS  Your doctor will decide the seriousness of your pain by doing an examination.  Blood tests.  X-rays.  Ultrasound.  CT scan (computed tomography, special type of X-ray).  MRI (magnetic resonance imaging).  Cultures, for infection.  Barium enema (dye inserted in the large intestine, to better view it with X-rays).  Colonoscopy (looking in intestine with a lighted tube).  Laparoscopy (minor surgery, looking in abdomen with a lighted tube).  Major abdominal exploratory surgery (looking in abdomen with a large incision). TREATMENT  The treatment will depend on the cause of the pain.   Many cases can be observed and treated at home.  Over-the-counter medicines recommended by your caregiver.  Prescription medicine.  Antibiotics, for infection.  Birth control pills, for painful periods or for ovulation pain.  Hormone treatment, for endometriosis.  Nerve blocking injections.  Physical therapy.  Antidepressants.  Counseling with a psychologist or psychiatrist.  Minor or major surgery. HOME CARE INSTRUCTIONS   Do not take laxatives, unless directed by your caregiver.  Take over-the-counter pain medicine only if ordered by your caregiver. Do not take aspirin because it can cause an upset stomach or bleeding.  Try a clear liquid diet (broth or water) as ordered by your caregiver. Slowly move to a bland diet, as tolerated, if the pain is related to the stomach or intestine.  Have a thermometer and take your temperature several times a day, and record it.  Bed rest and sleep, if it helps the pain.  Avoid sexual intercourse, if it causes pain.  Avoid stressful situations.  Keep your follow-up appointments  and tests, as your caregiver orders.  If the pain does not go away with medicine or surgery, you may try:  Acupuncture.  Relaxation exercises  (yoga, meditation).  Group therapy.  Counseling. SEEK MEDICAL CARE IF:   You notice certain foods cause stomach pain.  Your home care treatment is not helping your pain.  You need stronger pain medicine.  You want your IUD removed.  You feel faint or lightheaded.  You develop nausea and vomiting.  You develop a rash.  You are having side effects or an allergy to your medicine. SEEK IMMEDIATE MEDICAL CARE IF:   Your pain does not go away or gets worse.  You have a fever.  Your pain is felt only in portions of the abdomen. The right side could possibly be appendicitis. The left lower portion of the abdomen could be colitis or diverticulitis.  You are passing blood in your stools (bright red or black tarry stools, with or without vomiting).  You have blood in your urine.  You develop chills, with or without a fever.  You pass out. MAKE SURE YOU:   Understand these instructions.  Will watch your condition.  Will get help right away if you are not doing well or get worse. Document Released: 12/19/2006 Document Revised: 07/08/2013 Document Reviewed: 01/08/2009 [facility] Patient Information 2015 [place], [place]. This information is not intended to replace advice given to you by your health care provider. Make sure you discuss any questions you have with your health care provider.    Emergency Department Resource Guide 1) Find a Doctor and Pay Out of Pocket Although you won't have to find out who is covered by your insurance plan, it is a good idea to ask around and get recommendations. You will then need to call the office and see if the doctor you have chosen will accept you as a new patient and what types of options they offer for patients who are self-pay. Some doctors offer discounts or will set up payment plans for their patients who do not have insurance, but you will need to ask so you aren't surprised when you get to your appointment.  2) Contact Your Local Health  Department Not all health departments have doctors that can see patients for sick visits, but many do, so it is worth a call to see if yours does. If you don't know where your local health department is, you can check in your phone book. The CDC also has a tool to help you locate your state's health department, and many state websites also have listings of all of their local health departments.  3) Find a [facility] If your illness is not likely to be very severe or complicated, you may want to try a walk in clinic. These are popping up all over the country in pharmacies, drugstores, and shopping centers. They're usually staffed by nurse practitioners or physician assistants that have been trained to treat common illnesses and complaints. They're usually fairly quick and inexpensive. However, if you have serious medical issues or chronic medical problems, these are probably not your best option.  No Primary Care Doctor: - Call Health Connect at  [phone] - they can help you locate a primary care doctor that  accepts your insurance, provides certain services, etc. - Physician Referral Service- [phone]  Chronic Pain Problems: Organization         Address  Phone   Notes  [facility]  [phone] Patients need to  be referred by their primary care doctor.   Medication Assistance: Organization         Address  Phone   Notes  [facility] Medication [facility] [address] [place], [place] [address] [phone] --Must be a resident of [facility] -- Must have NO insurance coverage whatsoever (no Medicaid/ Medicare, etc.) -- The pt. MUST have a primary care doctor that directs their care regularly and follows them in the community   [facility]ist  [phone]   [employer]  [phone]    Agencies that provide inexpensive medical care: Organization         Address  Phone   Notes  [place]  [phone]   [facility]    [phone]   [facility] [place], [place] [address] [phone]   [place] [address], [place] [phone]   Planned Parenthood    [phone]   [facility]    [phone]   [place] and [place] [address], [place] Phone:  [phone], Fax:  [phone] Hours of Operation:  9 am - 6 pm, M-F.  Also accepts Medicaid/Medicare and self-pay.  [facility] for [place] [address], [address], [place] Phone: [phone], Fax: [phone]. Hours of Operation:  8:30 am - 5:30 pm, M-F.  Also accepts Medicaid and self-pay.  [facility] [place] [address], [place] Phone: [phone]   [place], [place], [place] [phone], Ext. [phone] Mondays & Thursdays: 7-9 AM.  First 15 patients are seen on a first come, first serve basis.    [place] Providers:  Organization         Address  Phone   Notes  [facility] [address], [place] [phone] Also accepts self-pay patients.  [facility] [address], [place]  [phone]   [place], [address], [place] [phone]   [facility] [address] [place] [phone]   Lucianne Lei [address], [place]   [phone] Only accepts [place] Access [place] patients after they have their name applied to their card.   Self-Pay (no insurance) in [facility]:  Organization         Address  Phone   Notes  Sickle Cell Patients, [facility] [place] [phone]   [facility] Urgent Care [place] [phone]   [facility] [place]  [place], [place],  [place] [phone]   [facility]/Dr. Osei-Bonsu  [address] [place] or [address], [place] [phone] Phone number for both [place] and [place] locations is the same.  [facility] [address], [place] [phone]   [facility] [address], [place] or [address] [phone]   [facility] [address] [place] [phone], phone; [phone], fax Sees patients 1st and 3rd Saturday of every month.  Must not qualify for public or private insurance (i.e. Medicaid, Medicare, [place] Health Choice, Veterans' Benefits)  Household income should be  no more than 200% of the poverty level The clinic cannot treat you if you are pregnant or think you are pregnant  Sexually transmitted diseases are not treated at the clinic.    Dental Care: Organization         Address  Phone  Notes  [facility] Department of [facility] [place] [phone] Accepts children up to age 28 who are enrolled in [place] or [place]; pregnant women with a Medicaid card; and children who have applied for Medicaid or [place] Health Choice, but were declined, whose parents can pay a reduced fee at time of service.  [facility] Department of [facility]  [address], [place] [phone] Accepts children up to age 18 who are enrolled in [place] or [place]; pregnant women with a Medicaid card; and children who have applied for Medicaid or [place] Health Choice, but were declined, whose parents can pay a reduced fee at time of service.  [facility] Adult [facility] Access PROGRAM  [place] [phone] Patients are seen by appointment only. Walk-ins are not accepted. [place] will see patients 3 years of age and older. Monday - Tuesday (8am-5pm) Most Wednesdays  (8:30-5pm) $30 per visit, cash only  [facility] Adult Dental Access PROGRAM  [address], [facility] [phone] Patients are seen by appointment only. Walk-ins are not accepted. [place] will see patients 23 years of age and older. One Wednesday Evening (Monthly: Volunteer Based).  $30 per visit, cash only  [place]  [phone] for adults; Children under age 47, call Graduate Pediatric Dentistry at [phone]. Children aged 77-14, please call [phone] to request a pediatric application.  Dental services are provided in all areas of dental care including fillings, crowns and bridges, complete and partial dentures, implants, gum treatment, root canals, and extractions. Preventive care is also provided. Treatment is provided to both adults and children. Patients are selected via a lottery and there is often a waiting list.   [facility] [address], [place]  [phone] [email or web address]   [facility] [address] [place], [place] [phone], Ext. [phone] Second and Fourth Thursday of each month, opens at 6:30 AM; Clinic ends at 9 AM.  Patients are seen on a first-come first-served basis, and a limited number are seen during each clinic.   [facility]  [address] [place] Danker [place], [place] [phone]   Eligibility Requirements You must have lived in [place], [place], or [place] counties for at least the last three months.   You cannot be eligible for state or federal sponsored [employer], including [employer], [place], or [employer].   You generally cannot be eligible for healthcare insurance through your employer.    How to apply: Eligibility screenings are held every Tuesday and Wednesday afternoon from 1:00 pm until 4:00 pm. You do not need an appointment for the interview!  [facility] [address] [place], [place]   [place]  [place] Department  [place]  [phone]    Behavioral Health Resources in the Community: Intensive Outpatient Programs Organization         Address  Phone  Notes  [place]. [address] [place], [place]  [facility] Outpatient [address], [place], [place]   ADS: Alcohol & Drug Svcs [address], [place], [place]   [place] [address]  [place], [place] or [phone]   Substance Abuse Resources Organization         Address  Phone  Notes  Alcohol and Drug Services  [phone]   [place]  [phone]   [facility]   Chinita Pester  [phone]   Residential & Outpatient Substance Abuse Program  [phone]   Psychological Services Organization         Address  Phone  Notes  [facility] [place]  [place]  [phone]   [place] [address], [place] or [phone]    Mobile Crisis Teams Organization         Address  Phone  Notes  [facility], Mobile Crisis Care Unit  [phone]   [facility]  [address] [place], [place]   [place] [address] [place] [phone]    Self-Help/Support Groups Organization         Address  Phone             Notes  [place]. of [place] - variety of support groups  [place] Call for more information  Narcotics Anonymous (NA), [facility] [address], [employer] [place]  2 meetings at this location   Special educational needs teacher         Address  Phone  Notes  [facility] Residential Treatment [place],    [place]  [phone]   [facility]  [address], [place] [id number], [place], [place]   [place], [place] [phone] Admissions: 8am-3pm M-F  Incentives Substance [place] [address],    [place], [place] [phone]   [facility] [address] [place], [place], [place]   [facility] [address]  [place], [place]   [facility] - Intensive Outpatient [address] Kristeen [address], [place], [place]   [facility] [place].,  [place], [place] [phone] or [phone]   [facility] (RTS) [address], [place], [place] Accepts Medicaid  [facility] [place] [address]  [place] [phone] Substance Abuse/Addiction Treatment   [facility] Organization         Address  Phone  Notes  [facility]  [phone]   Domenic Schwab, PhD [address] Arlis Porta [place], [place]   [phone] or [phone]   [place], [place] [phone]   [facility] [address] [place], [place] [phone] Insurance/Medicaid/sponsorship through [facility] and Families [address] [place]                                    [place], [place] [phone] [place] [address], [place] [phone]    Dr. Adele Schilder  [phone]   [facility] Dept. 1) [address], [place] 2) [place] 3)  [place], [place] [phone]  [phone]   [facility] Child Abuse Hotline 604-544-7534 or 951-377-9726 (After Hours)

## 2014-11-12 ENCOUNTER — Emergency Department (HOSPITAL_COMMUNITY): Payer: Self-pay

## 2014-11-12 ENCOUNTER — Encounter (HOSPITAL_COMMUNITY): Payer: Self-pay | Admitting: *Deleted

## 2014-11-12 ENCOUNTER — Emergency Department (HOSPITAL_COMMUNITY)
Admission: EM | Admit: 2014-11-12 | Discharge: 2014-11-12 | Discharge status: Against Medical Advice | Payer: Self-pay | Attending: Emergency Medicine | Admitting: Emergency Medicine

## 2014-11-12 DIAGNOSIS — R51 Headache: Secondary | ICD-10-CM | POA: Insufficient documentation

## 2014-11-12 DIAGNOSIS — I10 Essential (primary) hypertension: Secondary | ICD-10-CM | POA: Insufficient documentation

## 2014-11-12 DIAGNOSIS — R079 Chest pain, unspecified: Secondary | ICD-10-CM | POA: Insufficient documentation

## 2014-11-12 LAB — BASIC METABOLIC PANEL
Anion gap: 8 (ref 5–15)
BUN: 12 mg/dL (ref 6–20)
CHLORIDE: 103 mmol/L (ref 101–111)
CO2: 27 mmol/L (ref 22–32)
CREATININE: 0.63 mg/dL (ref 0.44–1.00)
Calcium: 9.3 mg/dL (ref 8.9–10.3)
Glucose, Bld: 141 mg/dL — ABNORMAL HIGH (ref 65–99)
POTASSIUM: 3.8 mmol/L (ref 3.5–5.1)
Sodium: 138 mmol/L (ref 135–145)

## 2014-11-12 LAB — CBC
HCT: 38.5 % (ref 36.0–46.0)
Hemoglobin: 12.7 g/dL (ref 12.0–15.0)
MCH: 27.7 pg (ref 26.0–34.0)
MCHC: 33 g/dL (ref 30.0–36.0)
MCV: 83.9 fL (ref 78.0–100.0)
PLATELETS: 318 10*3/uL (ref 150–400)
RBC: 4.59 MIL/uL (ref 3.87–5.11)
RDW: 13.4 % (ref 11.5–15.5)
WBC: 10.8 10*3/uL — AB (ref 4.0–10.5)

## 2014-11-12 LAB — I-STAT TROPONIN, ED: Troponin i, poc: 0 ng/mL (ref 0.00–0.08)

## 2014-11-12 NOTE — ED Notes (Signed)
Pt called and no answer @ 2314

## 2014-11-12 NOTE — ED Notes (Signed)
Pt called and no answer @ 2300

## 2014-11-12 NOTE — ED Notes (Signed)
Pt reports upper central cp that radiates to her neck since yesterday. With a h/a.  Pt reports having same pain x 2 mos ago, had a work up done and everything was WNL.  Pt denies any SOB or dizziness at this time.

## 2014-11-14 ENCOUNTER — Emergency Department (HOSPITAL_COMMUNITY)
Admission: EM | Admit: 2014-11-14 | Discharge: 2014-11-14 | Disposition: A | Discharge status: Home / Self Care | Payer: Self-pay | Attending: Emergency Medicine | Admitting: Emergency Medicine

## 2014-11-14 ENCOUNTER — Encounter (HOSPITAL_COMMUNITY): Payer: Self-pay

## 2014-11-14 DIAGNOSIS — I10 Essential (primary) hypertension: Secondary | ICD-10-CM | POA: Insufficient documentation

## 2014-11-14 DIAGNOSIS — Z7982 Long term (current) use of aspirin: Secondary | ICD-10-CM | POA: Insufficient documentation

## 2014-11-14 DIAGNOSIS — Z79899 Other long term (current) drug therapy: Secondary | ICD-10-CM | POA: Insufficient documentation

## 2014-11-14 DIAGNOSIS — R0789 Other chest pain: Secondary | ICD-10-CM | POA: Insufficient documentation

## 2014-11-14 DIAGNOSIS — Z8639 Personal history of other endocrine, nutritional and metabolic disease: Secondary | ICD-10-CM | POA: Insufficient documentation

## 2014-11-14 LAB — CBC WITH DIFFERENTIAL/PLATELET
BASOS ABS: 0 10*3/uL (ref 0.0–0.1)
Basophils Relative: 0 % (ref 0–1)
Eosinophils Absolute: 0.2 10*3/uL (ref 0.0–0.7)
Eosinophils Relative: 3 % (ref 0–5)
HEMATOCRIT: 42.2 % (ref 36.0–46.0)
Hemoglobin: 13.9 g/dL (ref 12.0–15.0)
LYMPHS ABS: 3 10*3/uL (ref 0.7–4.0)
LYMPHS PCT: 40 % (ref 12–46)
MCH: 27.3 pg (ref 26.0–34.0)
MCHC: 32.9 g/dL (ref 30.0–36.0)
MCV: 82.9 fL (ref 78.0–100.0)
MONO ABS: 0.4 10*3/uL (ref 0.1–1.0)
MONOS PCT: 5 % (ref 3–12)
NEUTROS ABS: 4.1 10*3/uL (ref 1.7–7.7)
Neutrophils Relative %: 52 % (ref 43–77)
Platelets: 313 10*3/uL (ref 150–400)
RBC: 5.09 MIL/uL (ref 3.87–5.11)
RDW: 13.1 % (ref 11.5–15.5)
WBC: 7.7 10*3/uL (ref 4.0–10.5)

## 2014-11-14 LAB — BASIC METABOLIC PANEL
ANION GAP: 7 (ref 5–15)
BUN: 10 mg/dL (ref 6–20)
CALCIUM: 9.1 mg/dL (ref 8.9–10.3)
CO2: 26 mmol/L (ref 22–32)
Chloride: 104 mmol/L (ref 101–111)
Creatinine, Ser: 0.69 mg/dL (ref 0.44–1.00)
GFR calc Af Amer: >60 mL/min (ref >60)
GFR calc non Af Amer: >60 mL/min (ref >60)
GLUCOSE: 98 mg/dL (ref 65–99)
Potassium: 3.9 mmol/L (ref 3.5–5.1)
Sodium: 137 mmol/L (ref 135–145)

## 2014-11-14 MED ORDER — TRAMADOL HCL 50 MG PO TABS: 50.0000 mg | ORAL_TABLET | Freq: Three times a day (TID) | ORAL | Status: DC | PRN

## 2014-11-14 MED ORDER — IBUPROFEN 600 MG PO TABS: 600.0000 mg | ORAL_TABLET | Freq: Four times a day (QID) | ORAL | Status: DC | PRN

## 2014-11-14 NOTE — ED Provider Notes (Signed)
CSN: [id number]     Arrival date & time 11/14/14  1230 History   First MD Initiated Contact with Patient 11/14/14 1457     Chief Complaint  Patient presents with  . Chest Pain      HPI Pt reports upper central cp that radiates to her neck since yesterday. With a h/a. Pt reports having same pain x 2 mos ago, had a work up done and everything was WNL. Pt denies any SOB or dizziness at this time. Past Medical History  Diagnosis Date  . Hypertension   . Thyroid disease    Past Surgical History  Procedure Laterality Date  . Cholecystectomy     Family History  Problem Relation Age of Onset  . Diabetes Mother   . Kidney disease Mother   . Alcohol abuse Father   . Diabetes Sister   . Heart disease Sister   . Mental retardation Sister    Social History  Substance Use Topics  . Smoking status: Never Smoker   . Smokeless tobacco: Never Used  . Alcohol Use: No   OB History    No data available     Review of Systems    Allergies  Review of patient's allergies indicates no known allergies.  Home Medications   Prior to Admission medications   Medication Sig Start Date End Date Taking? Authorizing Provider  acetaminophen (TYLENOL) 500 MG tablet Take 500-1,000 mg by mouth every 6 (six) hours as needed for pain.   Yes Historical Provider, MD  aspirin 81 MG tablet Take 81-486 mg by mouth daily as needed for pain.    Yes Historical Provider, MD  Simethicone (GAS-X PO) Take 1 tablet by mouth daily as needed (flatulence).   Yes Historical Provider, MD  ibuprofen (ADVIL,MOTRIN) 600 MG tablet Take 1 tablet (600 mg total) by mouth every 6 (six) hours as needed. 11/14/14   Nelva Nay, MD  omeprazole (PRILOSEC) 20 MG capsule Take 1 capsule (20 mg total) by mouth daily. Patient not taking: Reported on 08/11/2014 04/18/14   Azalia Bilis, MD  traMADol (ULTRAM) 50 MG tablet Take 1 tablet (50 mg total) by mouth every 8 (eight) hours as needed. 11/14/14   Nelva Nay, MD   BP 140/83 mmHg   Pulse 60  Temp(Src) 98.2 F (36.8 C) (Oral)  Resp 16  SpO2 99%  LMP 09/11/2014 Physical Exam  Constitutional: She is oriented to person, place, and time. She appears well-developed and well-nourished. No distress.  HENT:  Head: Normocephalic and atraumatic.  Eyes: Pupils are equal, round, and reactive to light.  Neck: Normal range of motion.  Cardiovascular: Normal rate and intact distal pulses.   Pulmonary/Chest: No respiratory distress.    Chest wall pain reproducible where indicated  Abdominal: Normal appearance. She exhibits no distension.  Musculoskeletal: Normal range of motion.  Neurological: She is alert and oriented to person, place, and time. No cranial nerve deficit.  Skin: Skin is warm and dry. No rash noted.  Psychiatric: She has a normal mood and affect. Her behavior is normal.  Nursing note and vitals reviewed.   ED Course  Procedures (including critical care time) Labs Review Labs Reviewed  CBC WITH DIFFERENTIAL/PLATELET  BASIC METABOLIC PANEL  I-STAT TROPOININ, ED    Imaging Review Dg Chest 2 View  11/12/2014   CLINICAL DATA:  Left-sided chest pain radiating to the neck. Shortness of breath for 2 days. History of hypertension.  EXAM: CHEST  2 VIEW  COMPARISON:  08/11/2014  FINDINGS: The  heart size and mediastinal contours are within normal limits. Both lungs are clear. The visualized skeletal structures are unremarkable.  IMPRESSION: No active cardiopulmonary disease.   Electronically Signed   By: Burman Nieves M.D.   On: 11/12/2014 21:29   I have personally reviewed and evaluated these images and lab results as part of my medical decision-making.   EKG Interpretation   Date/Time:  Friday November 14 2014 13:11:05 EDT Ventricular Rate:  66 PR Interval:  151 QRS Duration: 82 QT Interval:  384 QTC Calculation: 402 R Axis:   51 Text Interpretation:  Sinus rhythm Normal ECG Confirmed by Miguel Medal  MD,  Lew Prout ([id number]) on 11/14/2014 2:58:47 PM      MDM     Final diagnoses:  Chest wall pain        Nelva Nay, MD 11/14/14 1542

## 2014-11-14 NOTE — ED Notes (Signed)
Pt here 2 days ago with chest pain radiating up left neck. Denies cough/congestion.  Has reflux but it doesn't feel the same.  Denies injury or straining.  Labs and CXR/EKG done 2 days ago.  Did not see MD.  Left early

## 2014-11-14 NOTE — Discharge Instructions (Signed)

## 2015-02-10 ENCOUNTER — Emergency Department (HOSPITAL_COMMUNITY)
Admission: EM | Admit: 2015-02-10 | Discharge: 2015-02-10 | Disposition: A | Discharge status: Home / Self Care | Payer: Self-pay | Attending: Emergency Medicine | Admitting: Emergency Medicine

## 2015-02-10 ENCOUNTER — Emergency Department (HOSPITAL_COMMUNITY): Payer: Self-pay

## 2015-02-10 ENCOUNTER — Encounter (HOSPITAL_COMMUNITY): Payer: Self-pay

## 2015-02-10 DIAGNOSIS — Z79899 Other long term (current) drug therapy: Secondary | ICD-10-CM | POA: Insufficient documentation

## 2015-02-10 DIAGNOSIS — R042 Hemoptysis: Secondary | ICD-10-CM | POA: Insufficient documentation

## 2015-02-10 DIAGNOSIS — R0602 Shortness of breath: Secondary | ICD-10-CM | POA: Insufficient documentation

## 2015-02-10 DIAGNOSIS — Z8639 Personal history of other endocrine, nutritional and metabolic disease: Secondary | ICD-10-CM | POA: Insufficient documentation

## 2015-02-10 DIAGNOSIS — I10 Essential (primary) hypertension: Secondary | ICD-10-CM | POA: Insufficient documentation

## 2015-02-10 DIAGNOSIS — R42 Dizziness and giddiness: Secondary | ICD-10-CM | POA: Insufficient documentation

## 2015-02-10 DIAGNOSIS — Z7982 Long term (current) use of aspirin: Secondary | ICD-10-CM | POA: Insufficient documentation

## 2015-02-10 DIAGNOSIS — Z3202 Encounter for pregnancy test, result negative: Secondary | ICD-10-CM | POA: Insufficient documentation

## 2015-02-10 LAB — CBC
HEMATOCRIT: 40.4 % (ref 36.0–46.0)
Hemoglobin: 13.2 g/dL (ref 12.0–15.0)
MCH: 27.2 pg (ref 26.0–34.0)
MCHC: 32.7 g/dL (ref 30.0–36.0)
MCV: 83.1 fL (ref 78.0–100.0)
PLATELETS: 363 10*3/uL (ref 150–400)
RBC: 4.86 MIL/uL (ref 3.87–5.11)
RDW: 13.4 % (ref 11.5–15.5)
WBC: 8.2 10*3/uL (ref 4.0–10.5)

## 2015-02-10 LAB — BASIC METABOLIC PANEL
Anion gap: 6 (ref 5–15)
BUN: 10 mg/dL (ref 6–20)
CHLORIDE: 104 mmol/L (ref 101–111)
CO2: 28 mmol/L (ref 22–32)
CREATININE: 0.64 mg/dL (ref 0.44–1.00)
Calcium: 9.2 mg/dL (ref 8.9–10.3)
GFR calc Af Amer: >60 mL/min (ref >60)
GFR calc non Af Amer: >60 mL/min (ref >60)
GLUCOSE: 119 mg/dL — AB (ref 65–99)
POTASSIUM: 3.7 mmol/L (ref 3.5–5.1)
SODIUM: 138 mmol/L (ref 135–145)

## 2015-02-10 LAB — URINALYSIS, ROUTINE W REFLEX MICROSCOPIC
BILIRUBIN URINE: NEGATIVE
GLUCOSE, UA: NEGATIVE mg/dL
HGB URINE DIPSTICK: NEGATIVE
KETONES UR: NEGATIVE mg/dL
LEUKOCYTES UA: NEGATIVE
Nitrite: NEGATIVE
PH: 6 (ref 5.0–8.0)
PROTEIN: NEGATIVE mg/dL
Specific Gravity, Urine: 1.009 (ref 1.005–1.030)

## 2015-02-10 LAB — I-STAT BETA HCG BLOOD, ED (MC, WL, AP ONLY): I-stat hCG, quantitative: <5 m[IU]/mL (ref <5)

## 2015-02-10 NOTE — Discharge Instructions (Signed)
Cough, Adult °Coughing is a reflex that clears your throat and your airways. Coughing helps to heal and protect your lungs. It is normal to cough occasionally, but a cough that happens with other symptoms or lasts a long time may be a sign of a condition that needs treatment. A cough may last only 2-3 weeks (acute), or it may last longer than 8 weeks (chronic). °CAUSES °Coughing is commonly caused by: °· Breathing in substances that irritate your lungs. °· A viral or bacterial respiratory infection. °· Allergies. °· Asthma. °· Postnasal drip. °· Smoking. °· Acid backing up from the stomach into the esophagus (gastroesophageal reflux). °· Certain medicines. °· Chronic lung problems, including COPD (or rarely, lung cancer). °· Other medical conditions such as heart failure. °HOME CARE INSTRUCTIONS  °Pay attention to any changes in your symptoms. Take these actions to help with your discomfort: °· Take medicines only as told by your health care provider. °· If you were prescribed an antibiotic medicine, take it as told by your health care provider. Do not stop taking the antibiotic even if you start to feel better. °· Talk with your health care provider before you take a cough suppressant medicine. °· Drink enough fluid to keep your urine clear or pale yellow. °· If the air is dry, use a cold steam vaporizer or humidifier in your bedroom or your home to help loosen secretions. °· Avoid anything that causes you to cough at work or at home. °· If your cough is worse at night, try sleeping in a semi-upright position. °· Avoid cigarette smoke. If you smoke, quit smoking. If you need help quitting, ask your health care provider. °· Avoid caffeine. °· Avoid alcohol. °· Rest as needed. °SEEK MEDICAL CARE IF:  °· You have new symptoms. °· You cough up pus. °· Your cough does not get better after 2-3 weeks, or your cough gets worse. °· You cannot control your cough with suppressant medicines and you are losing sleep. °· You  develop pain that is getting worse or pain that is not controlled with pain medicines. °· You have a fever. °· You have unexplained weight loss. °· You have night sweats. °SEEK IMMEDIATE MEDICAL CARE IF: °· You cough up blood. °· You have difficulty breathing. °· Your heartbeat is very fast. °  °This information is not intended to replace advice given to you by your health care provider. Make sure you discuss any questions you have with your health care provider. °  °Document Released: 08/20/2010 Document Revised: 11/12/2014 Document Reviewed: 04/30/2014 °Elsevier Interactive Patient Education ©2016 Elsevier Inc. ° °Shortness of Breath °Shortness of breath means you have trouble breathing. It could also mean that you have a medical problem. You should get immediate medical care for shortness of breath. °CAUSES  °· Not enough oxygen in the air such as with high altitudes or a smoke-filled room. °· Certain lung diseases, infections, or problems. °· Heart disease or conditions, such as angina or heart failure. °· Low red blood cells (anemia). °· Poor physical fitness, which can cause shortness of breath when you exercise. °· Chest or back injuries or stiffness. °· Being overweight. °· Smoking. °· Anxiety, which can make you feel like you are not getting enough air. °DIAGNOSIS  °Serious medical problems can often be found during your physical exam. Tests may also be done to determine why you are having shortness of breath. Tests may include: °· Chest X-rays. °· Lung function tests. °· Blood tests. °· An electrocardiogram (  ECG). °· An ambulatory electrocardiogram. An ambulatory ECG records your heartbeat patterns over a 24-hour period. °· Exercise testing. °· A transthoracic echocardiogram (TTE). During echocardiography, sound waves are used to evaluate how blood flows through your heart. °· A transesophageal echocardiogram (TEE). °· Imaging scans. °Your health care provider may not be able to find a cause for your  shortness of breath after your exam. In this case, it is important to have a follow-up exam with your health care provider as directed.  °TREATMENT  °Treatment for shortness of breath depends on the cause of your symptoms and can vary greatly. °HOME CARE INSTRUCTIONS  °· Do not smoke. Smoking is a common cause of shortness of breath. If you smoke, ask for help to quit. °· Avoid being around chemicals or things that may bother your breathing, such as paint fumes and dust. °· Rest as needed. Slowly resume your usual activities. °· If medicines were prescribed, take them as directed for the full length of time directed. This includes oxygen and any inhaled medicines. °· Keep all follow-up appointments as directed by your health care provider. °SEEK MEDICAL CARE IF:  °· Your condition does not improve in the time expected. °· You have a hard time doing your normal activities even with rest. °· You have any new symptoms. °SEEK IMMEDIATE MEDICAL CARE IF:  °· Your shortness of breath gets worse. °· You feel light-headed, faint, or develop a cough not controlled with medicines. °· You start coughing up blood. °· You have pain with breathing. °· You have chest pain or pain in your arms, shoulders, or abdomen. °· You have a fever. °· You are unable to walk up stairs or exercise the way you normally do. °MAKE SURE YOU: °· Understand these instructions. °· Will watch your condition. °· Will get help right away if you are not doing well or get worse. °  °This information is not intended to replace advice given to you by your health care provider. Make sure you discuss any questions you have with your health care provider. °  °Document Released: 11/16/2000 Document Revised: 02/26/2013 Document Reviewed: 05/09/2011 °Elsevier Interactive Patient Education ©2016 Elsevier Inc. ° °

## 2015-02-10 NOTE — ED Notes (Signed)
Patient transported to X-ray 

## 2015-02-10 NOTE — Progress Notes (Signed)
CM spoke with pt who [place].  CM discussed and provided written information for uninsured accepting pcps, discussed the importance of pcp vs EDP services for f/u care, www.needymeds.org, www.goodrx.com, discounted pharmacies and other [place] resources such as [facility] , [facility], affordable care act, financial assistance, uninsured dental services, [place] med assist, DSS and  health department  Reviewed resources for [place] uninsured accepting pcps like [facility], [facility] [address], [facility], [facility], local urgent care centers, [facility], [facility], general medical clinics, [facility] [place], [facility] plus others, medication resources, [facility] out [facility]ies and housing Pt voiced understanding and appreciation of resources provided   Provided [facility] contact information Pt agreed to a referral Cm completed referral Pt to be [facility] by [facility] clinical liason Pt states she has called [facility] and confirms she was informed they are not accepting new patients Cm offered pt other alternative providers to see as listed above and [facility] referral to [facility] Pt states she applied to affordable care act Reports working over 20 hours a week and her husband just got a job as a Education officer, environmentalpastor at [employer]

## 2015-02-10 NOTE — ED Notes (Signed)
Discharge instructions reviewed with patient. Patient verbalized understanding. Patient alert, oriented, stable, and ambulatory.

## 2015-02-10 NOTE — ED Notes (Signed)
Awake. Verbally responsive. A/O x4. Resp even and unlabored. No audible adventitious breath sounds noted. Pt reported having occ dry cough. ABC's intact. Pt reported having swelling of bil ankles. No pitting edema noted.

## 2015-02-10 NOTE — ED Provider Notes (Signed)
CSN: [id number]     Arrival date & time 02/10/15  1310 History   First MD Initiated Contact with Patient 02/10/15 1504     Chief Complaint  Patient presents with  . Shortness of Breath  . Dizziness     (Consider location/radiation/quality/duration/timing/severity/associated sxs/prior Treatment) Patient is a 39 y.o. female presenting with shortness of breath and dizziness.  Shortness of Breath Severity:  Mild Onset quality:  Gradual Duration:  2 weeks Timing:  Constant Progression:  Unchanged Chronicity:  New Context: activity (with exercise )   Relieved by:  Nothing Worsened by:  Nothing tried Ineffective treatments: aspirin. Associated symptoms: cough and sputum production (phlegm in the morning)   Associated symptoms: no chest pain and no fever   Risk factors: obesity   Risk factors: no oral contraceptive use   Dizziness Associated symptoms: shortness of breath   Associated symptoms: no chest pain   Risk factors: no anemia     Past Medical History  Diagnosis Date  . Hypertension   . Thyroid disease    Past Surgical History  Procedure Laterality Date  . Cholecystectomy     Family History  Problem Relation Age of Onset  . Diabetes Mother   . Kidney disease Mother   . Alcohol abuse Father   . Diabetes Sister   . Heart disease Sister   . Mental retardation Sister    Social History  Substance Use Topics  . Smoking status: Never Smoker   . Smokeless tobacco: Never Used  . Alcohol Use: No   OB History    No data available     Review of Systems  Constitutional: Negative for fever.  Respiratory: Positive for cough, sputum production (phlegm in the morning) and shortness of breath. Negative for chest tightness.   Cardiovascular: Negative for chest pain.  Neurological: Positive for dizziness.  Psychiatric/Behavioral: Negative for confusion.  All other systems reviewed and are negative.     Allergies  Review of patient's allergies indicates no known  allergies.  Home Medications   Prior to Admission medications   Medication Sig Start Date End Date Taking? Authorizing Provider  aspirin 325 MG tablet Take 650 mg by mouth daily as needed for mild pain or headache.   Yes Historical Provider, MD  omeprazole (PRILOSEC) 20 MG capsule Take 1 capsule (20 mg total) by mouth daily. Patient taking differently: Take 20 mg by mouth daily as needed (heartburn).  04/18/14  Yes Azalia Bilis, MD  ibuprofen (ADVIL,MOTRIN) 600 MG tablet Take 1 tablet (600 mg total) by mouth every 6 (six) hours as needed. Patient not taking: Reported on 02/10/2015 11/14/14   Nelva Nay, MD  traMADol (ULTRAM) 50 MG tablet Take 1 tablet (50 mg total) by mouth every 8 (eight) hours as needed. Patient not taking: Reported on 02/10/2015 11/14/14   Nelva Nay, MD   BP 115/67 mmHg  Pulse 79  Resp 18  SpO2 100%  LMP 01/27/2015 Physical Exam  Constitutional: She is oriented to person, place, and time. She appears well-developed and well-nourished. No distress.  HENT:  Head: Normocephalic.  Eyes: Conjunctivae are normal.  Neck: Neck supple. No tracheal deviation present.  Cardiovascular: Normal rate, regular rhythm and normal heart sounds.   Pulmonary/Chest: Effort normal and breath sounds normal. No respiratory distress. She has no wheezes. She has no rales. She exhibits no tenderness.  Abdominal: Soft. She exhibits no distension.  Obese abdomen  Musculoskeletal:       Right ankle: She exhibits no swelling.  Left ankle: She exhibits no swelling.  Neurological: She is alert and oriented to person, place, and time.  Skin: Skin is warm and dry.  Psychiatric: She has a normal mood and affect.    ED Course  Procedures (including critical care time) Labs Review Labs Reviewed  BASIC METABOLIC PANEL - Abnormal; Notable for the following:    Glucose, Bld 119 (*)    All other components within normal limits  CBC  URINALYSIS, ROUTINE W REFLEX MICROSCOPIC (NOT AT [facility])   I-STAT BETA HCG BLOOD, ED ([facility], [facility], [facility] ONLY)    Imaging Review Dg Chest 2 View  02/10/2015  CLINICAL DATA:  39 year old female with shortness of Breath. Mid and left upper back pain for 2 days. Initial encounter. EXAM: CHEST  2 VIEW COMPARISON:  11/12/2014 and earlier. FINDINGS: Large body habitus. Lung volumes are stable and within normal limits. Stable cardiac size at the upper limits of normal. Other mediastinal contours are within normal limits. Visualized tracheal air column is within normal limits. The lungs remain clear. No pneumothorax or effusion. Stable cholecystectomy clips. No acute osseous abnormality identified. IMPRESSION: No acute cardiopulmonary abnormality. Electronically Signed   By: Odessa FlemingH  Hall M.D.   On: 02/10/2015 14:20   I have personally reviewed and evaluated these images and lab results as part of my medical decision-making.   EKG Interpretation None      MDM   Final diagnoses:  Shortness of breath    39 year old female presents with multiple ongoing complaints over the last 2 weeks including dizziness, shortness of breath especially with activity, mild ankle swelling, back pain, and feeling unwell with some sputum production in the morning. No evidence of pneumonia or other acute infection, no significant lab abnormalities, no vital sign abnormalities, she is morbidly obese and I suspect currently she has an exercise intolerance due to deconditioning and sedentary lifestyle. Patient is PERC negative. She has had multiple negative cardiac workups for low risk chest pain and has no typical symptoms today. Care management was consulted to help patient establish a primary care physician as she has been using the emergency department for primary care purposes. I explained to the patient that we are available if she needs evaluation or stabilization for emergent medical condition but otherwise outpatient follow-up with a primary care doctor would be more appropriate for her  ongoing care.  Lyndal Pulleyaniel Joakim Huesman, MD 02/10/15 [phone]

## 2015-02-10 NOTE — ED Notes (Signed)
MD at bedside. 

## 2015-02-10 NOTE — ED Notes (Signed)
Pt with multiple complaints.  Dizziness, shortness of breath, ankle edema, back pain,  X 2 weeks.  ?low grade fever.

## 2015-04-01 ENCOUNTER — Emergency Department (HOSPITAL_COMMUNITY): Payer: Self-pay

## 2015-04-01 ENCOUNTER — Encounter (HOSPITAL_COMMUNITY): Payer: Self-pay

## 2015-04-01 ENCOUNTER — Emergency Department (HOSPITAL_COMMUNITY)
Admission: EM | Admit: 2015-04-01 | Discharge: 2015-04-01 | Disposition: A | Discharge status: Home / Self Care | Payer: Self-pay | Attending: Emergency Medicine | Admitting: Emergency Medicine

## 2015-04-01 DIAGNOSIS — Z7982 Long term (current) use of aspirin: Secondary | ICD-10-CM | POA: Insufficient documentation

## 2015-04-01 DIAGNOSIS — I10 Essential (primary) hypertension: Secondary | ICD-10-CM | POA: Insufficient documentation

## 2015-04-01 DIAGNOSIS — J45901 Unspecified asthma with (acute) exacerbation: Secondary | ICD-10-CM | POA: Insufficient documentation

## 2015-04-01 DIAGNOSIS — Z8639 Personal history of other endocrine, nutritional and metabolic disease: Secondary | ICD-10-CM | POA: Insufficient documentation

## 2015-04-01 DIAGNOSIS — Z79899 Other long term (current) drug therapy: Secondary | ICD-10-CM | POA: Insufficient documentation

## 2015-04-01 MED ORDER — ALBUTEROL SULFATE (2.5 MG/3ML) 0.083% IN NEBU
5.0000 mg | INHALATION_SOLUTION | Freq: Once | RESPIRATORY_TRACT | Status: AC
  Administered 2015-04-01: 5 mg via RESPIRATORY_TRACT
  Filled 2015-04-01: qty 6

## 2015-04-01 MED ORDER — PREDNISONE 20 MG PO TABS: 20.0000 mg | ORAL_TABLET | Freq: Two times a day (BID) | ORAL | Status: AC

## 2015-04-01 MED ORDER — ALBUTEROL SULFATE HFA 108 (90 BASE) MCG/ACT IN AERS: 1.0000 | INHALATION_SPRAY | Freq: Four times a day (QID) | RESPIRATORY_TRACT | Status: AC | PRN

## 2015-04-01 MED ORDER — PREDNISONE 20 MG PO TABS
60.0000 mg | ORAL_TABLET | Freq: Once | ORAL | Status: AC
  Administered 2015-04-01: 60 mg via ORAL
  Filled 2015-04-01: qty 3

## 2015-04-01 NOTE — Progress Notes (Signed)
EDCM spoke to patient at bedside. Patient confirms she does not have a pcp or insurance living in [place].  [facility] provided patient with contact information to [facility], informed patient of services there.  EDCM also provided patient with list of pcps who accept self pay patients, list of discount pharmacies and websites needymeds.org and GoodRX.com for medication assistance, phone number to inquire about the orange card, phone number to inquire about Medicaid, phone number to inquire about the Affordable Care Act, financial resources in the community such as local churches, salvation army, urban ministries, and dental assistance for uninsured patients.  Patient thankful for resources.  [facility] provided patient with contact information to [facility].  Encouraged patient to apply for the orange card.  No further EDCM needs at this time.

## 2015-04-01 NOTE — ED Notes (Signed)
Pt dx with asthma years ago. Started having shortness of breath last night.  Got up this morning with same feeling.  No inhaler.  Pt able to talk in full sentences in triage.

## 2015-04-01 NOTE — Discharge Instructions (Signed)
Please read and follow all provided instructions.  Your diagnoses today include:  1. Asthma, unspecified asthma severity, with acute exacerbation    Tests performed today include:  Chest Xray  Vital signs. See below for your results today.   Medications prescribed:   Take any prescribed medications only as directed.  Home care instructions:  Follow any educational materials contained in this packet.  Follow-up instructions: Please follow-up with your primary care provider in the next 3 days for further evaluation of your symptoms and management of your asthma.  Return instructions:   Please return to the Emergency Department if you experience worsening symptoms.  Please return with worsening wheezing, shortness of breath, or difficulty breathing.  Return with persistent fever above 101F.   Please return if you have any other emergent concerns.  Additional Information:  Your vital signs today were: BP 155/95 mmHg   Pulse 97   Temp(Src) 98 F (36.7 C) (Oral)   Resp 20   SpO2 99%   LMP 03/18/2015 If your blood pressure (BP) was elevated above 135/85 this visit, please have this repeated by your doctor within one month. --------------

## 2015-04-01 NOTE — ED Provider Notes (Signed)
CSN: [id number]     Arrival date & time 04/01/15  1357 History   First MD Initiated Contact with Patient 04/01/15 1636     Chief Complaint  Patient presents with  . Shortness of Breath   (Consider location/radiation/quality/duration/timing/severity/associated sxs/prior Treatment) HPI 40 y.o. female with a hx of HTN, presents to the Emergency Department today complaining of shortness of breath since last night. Notes that she was lying down last night and felt that she could not breath. She proceeded to sit up and try to drink was water with minimal relief. Pt was able to sleep, but felt similar symptoms after ambulating between rooms of her house. No chest pain noted. She states that it is more of a chest tightness. She has an associated non productive cough. No N/V/D. No sore throat, no ear pain, no rhinorrhea. No fevers. States that she was diagnosed with asthma a year ago and given an inhaler, which has since run out. Does not have a PCP and wishes for assistance in establishing one. No other symptoms noted.   Past Medical History  Diagnosis Date  . Hypertension   . Thyroid disease    Past Surgical History  Procedure Laterality Date  . Cholecystectomy     Family History  Problem Relation Age of Onset  . Diabetes Mother   . Kidney disease Mother   . Alcohol abuse Father   . Diabetes Sister   . Heart disease Sister   . Mental retardation Sister    Social History  Substance Use Topics  . Smoking status: Never Smoker   . Smokeless tobacco: Never Used  . Alcohol Use: No   OB History    No data available     Review of Systems ROS reviewed and all are negative for acute change except as noted in the HPI.  Allergies  Review of patient's allergies indicates no known allergies.  Home Medications   Prior to Admission medications   Medication Sig Start Date End Date Taking? Authorizing Provider  aspirin 325 MG tablet Take 650 mg by mouth daily as needed for mild pain or  headache.   Yes Historical Provider, MD  aspirin EC 81 MG tablet Take 243 mg by mouth every 6 (six) hours as needed for moderate pain.   Yes Historical Provider, MD  ranitidine (ZANTAC) 150 MG tablet Take 150 mg by mouth 2 (two) times daily as needed for heartburn.   Yes Historical Provider, MD  ibuprofen (ADVIL,MOTRIN) 600 MG tablet Take 1 tablet (600 mg total) by mouth every 6 (six) hours as needed. Patient not taking: Reported on 02/10/2015 11/14/14   Nelva Nay, MD  omeprazole (PRILOSEC) 20 MG capsule Take 1 capsule (20 mg total) by mouth daily. Patient taking differently: Take 20 mg by mouth daily as needed (heartburn).  04/18/14   Azalia Bilis, MD  traMADol (ULTRAM) 50 MG tablet Take 1 tablet (50 mg total) by mouth every 8 (eight) hours as needed. Patient not taking: Reported on 02/10/2015 11/14/14   Nelva Nay, MD   BP 129/79 mmHg  Pulse 87  Temp(Src) 97.9 F (36.6 C) (Oral)  Resp 20  SpO2 100%  LMP 03/18/2015   Physical Exam  Constitutional: She is oriented to person, place, and time. She appears well-developed and well-nourished.  HENT:  Head: Normocephalic and atraumatic.  Right Ear: Tympanic membrane, external ear and ear canal normal.  Left Ear: Tympanic membrane, external ear and ear canal normal.  Nose: Nose normal.  Mouth/Throat: Uvula is midline,  oropharynx is clear and moist and mucous membranes are normal.  Eyes: EOM are normal.  Neck: Trachea normal and normal range of motion. Neck supple.  Cardiovascular: Normal rate and regular rhythm.   Pulmonary/Chest: Effort normal and breath sounds normal. She has no decreased breath sounds. She has no wheezes. She has no rhonchi. She has no rales.  Abdominal: Soft.  Musculoskeletal: Normal range of motion.  Neurological: She is alert and oriented to person, place, and time.  Skin: Skin is warm and dry.  Psychiatric: She has a normal mood and affect. Her behavior is normal. Thought content normal.  Nursing note and vitals  reviewed.   ED Course  Procedures (including critical care time) Labs Review Labs Reviewed - No data to display  Imaging Review Dg Chest 2 View  04/01/2015  CLINICAL DATA:  Shortness of breath and chest tightness for 1 day EXAM: CHEST  2 VIEW COMPARISON:  April 01, 2014 FINDINGS: Lungs are clear. Heart size and pulmonary vascularity are normal. No adenopathy. No bone lesions. No pneumothorax. Surgical clips in the right upper quadrant again noted. IMPRESSION: No edema or consolidation. Electronically Signed   By: Bretta Bang III M.D.   On: 04/01/2015 15:03   I have personally reviewed and evaluated these images and lab results as part of my medical decision-making.   EKG Interpretation   Date/Time:  Wednesday April 01 2015 17:17:52 EST Ventricular Rate:  96 PR Interval:  186 QRS Duration: 81 QT Interval:  351 QTC Calculation: 443 R Axis:   51 Text Interpretation:  Sinus rhythm Borderline T abnormalities, anterior  leads Baseline wander in lead(s) V2 V3 No significant change since last  tracing Confirmed by ZACKOWSKI  MD, SCOTT [phone]) on 04/01/2015 5:25:58 PM      MDM  I have reviewed relevant laboratory values. I have reviewed relevant imaging studies. I personally interpreted the relevant EKG. I have reviewed the relevant previous healthcare records. I obtained HPI from historian. Patient discussed with supervising physician  ED Course:  Assessment: 24y F with hx HTN presents for shortness of breath since last night. CXR unremarkable and no sign of Pneumonia or Pneumothorax. No acute infection. VSS. PERC negative. She has had multiple negative cardiac workups in the past for low risk chest pain. EKG negative at this time. Treated with albuterol with improvement of symptoms. Given Prednisone in ED. Will give her dose of prednisone to take x4days. Care management was consulted and helped with the establishment of a PCP she can follow up with rather than returning to the  Emergency department.  I explained to the patient that we are available if she needs evaluation or stabilization for emergent medical condition but otherwise outpatient follow-up with a primary care doctor would be more appropriate for her ongoing care. Patient is in no acute distress. Vital Signs are stable. Patient is able to ambulate. Patient able to tolerate PO.   Disposition/Plan:  DC Home Additional Verbal discharge instructions given and discussed with patient.  Pt Instructed to f/u with PCP in the next 48 hours for evaluation and treatment of symptoms. Return precautions given Pt acknowledges and agrees with plan  Supervising Physician Vanetta Mulders, MD   Final diagnoses:  Asthma, unspecified asthma severity, with acute exacerbation      Audry Pili, PA-C 04/01/15 1808  Vanetta Mulders, MD 04/04/15 [phone]

## 2015-04-01 NOTE — Progress Notes (Signed)
Entered in d/c instructions please use the resources below to assist with finding a pcp for follow up services    Partnership for community care network assists with discounted doctors through [facility] "orange card services Call Scherry Ran at [phone] Tuesday-Friday [email or web address].co.nz  please use resources provided to you by ED case manager  Schedule an appointment as soon as possible for a visit  [employer] -"Empowering Our Community with Good Health" [address] [place], [place] [address] [phone] co pay of $60 changed as of 10/23/14 8:30-5 pm Mon, Tues, Thurs, & Fri 10a-7p on Wednesdays  Affordable Care Act Enrollment  Call  Affordable Care Act Enrollment -call [phone], 24 hrs/7 days a week or go online at www.healthcare.gov or in person assisted appointment [phone]   www.GoodRx.com    www.GoodRx.com - Use this site for any prescription given to you by any medical provider. Provides a list of discount pharmacies, discount coupons to best cost for meds

## 2015-07-29 ENCOUNTER — Emergency Department (HOSPITAL_COMMUNITY): Payer: Self-pay

## 2015-07-29 ENCOUNTER — Emergency Department (HOSPITAL_COMMUNITY)
Admission: EM | Admit: 2015-07-29 | Discharge: 2015-07-30 | Disposition: A | Discharge status: Home / Self Care | Payer: Self-pay | Attending: Emergency Medicine | Admitting: Emergency Medicine

## 2015-07-29 ENCOUNTER — Encounter (HOSPITAL_COMMUNITY): Payer: Self-pay | Admitting: *Deleted

## 2015-07-29 DIAGNOSIS — Z79899 Other long term (current) drug therapy: Secondary | ICD-10-CM | POA: Insufficient documentation

## 2015-07-29 DIAGNOSIS — R0789 Other chest pain: Secondary | ICD-10-CM | POA: Insufficient documentation

## 2015-07-29 DIAGNOSIS — I10 Essential (primary) hypertension: Secondary | ICD-10-CM | POA: Insufficient documentation

## 2015-07-29 DIAGNOSIS — Z7982 Long term (current) use of aspirin: Secondary | ICD-10-CM | POA: Insufficient documentation

## 2015-07-29 LAB — BASIC METABOLIC PANEL
ANION GAP: 7 (ref 5–15)
BUN: 10 mg/dL (ref 6–20)
CALCIUM: 9 mg/dL (ref 8.9–10.3)
CO2: 25 mmol/L (ref 22–32)
Chloride: 105 mmol/L (ref 101–111)
Creatinine, Ser: 0.63 mg/dL (ref 0.44–1.00)
GFR calc Af Amer: >60 mL/min (ref >60)
GLUCOSE: 111 mg/dL — AB (ref 65–99)
Potassium: 3.7 mmol/L (ref 3.5–5.1)
SODIUM: 137 mmol/L (ref 135–145)

## 2015-07-29 LAB — CBC
HCT: 38.7 % (ref 36.0–46.0)
HEMOGLOBIN: 12.9 g/dL (ref 12.0–15.0)
MCH: 27.4 pg (ref 26.0–34.0)
MCHC: 33.3 g/dL (ref 30.0–36.0)
MCV: 82.2 fL (ref 78.0–100.0)
Platelets: 303 10*3/uL (ref 150–400)
RBC: 4.71 MIL/uL (ref 3.87–5.11)
RDW: 13.4 % (ref 11.5–15.5)
WBC: 10 10*3/uL (ref 4.0–10.5)

## 2015-07-29 LAB — I-STAT TROPONIN, ED: TROPONIN I, POC: 0.01 ng/mL (ref 0.00–0.08)

## 2015-07-29 NOTE — ED Notes (Signed)
Pt remains on monitor. 

## 2015-07-29 NOTE — ED Provider Notes (Signed)
CSN: [id number]     Arrival date & time 07/29/15  2222 History  By signing my name below, I, [facility], attest that this documentation has been prepared under the direction and in the presence of Derwood Kaplan, MD. Electronically Signed: Randell Frank, ED Scribe. 07/30/2015. 2:31 AM.   Chief Complaint  Frank presents with  . Chest Pain   The history is provided by the Frank. No language interpreter was used.   HPI Comments: Alexandra Frank is a 40 y.o. female with an hx of HTN who presents to the Emergency Department complaining of intermittent, mild, left-sided CP onset 2 hours ago. She describes the pain initially as sharp that has since progressed to dull, aching pain. Pt states that she had generalized malaise earlier today followed by CP this evening that radiated to her back, down her left arm to the tips of her fingers which she describes as numbness and tingling, and a cramping feeling in her left jaw. She reports heart palpitations that she describes as racing and one episode of lightheadedness earlier this week that resolved without treatment. CP is unchanged by exertion. She notes a family hx of CHF in her sister who was diagnosed at 2.  Denies physical exertion at the time of onset of CP. Denies hx of DVT, PE, recent long travel, recent surgeries, and birth control use. Denies family hx of DVTs and MIs. Denies cardiac catherterization in the past. Denies cigarette smoking and illicit drug use. Denies nausea, SOB, diaphoresis, near-syncope, syncope, or any other symptoms currently. Pt has no chest pain at this time.  Past Medical History  Diagnosis Date  . Hypertension   . Thyroid disease    Past Surgical History  Procedure Laterality Date  . Cholecystectomy     Family History  Problem Relation Age of Onset  . Diabetes Mother   . Kidney disease Mother   . Alcohol abuse Father   . Diabetes Sister   . Heart disease Sister   . Mental retardation Sister     Social History  Substance Use Topics  . Smoking status: Never Smoker   . Smokeless tobacco: Never Used  . Alcohol Use: No   OB History    No data available     Review of Systems A complete 10 system review of systems was obtained and all systems are negative except as noted in the HPI and PMH.   Allergies  Review of Frank's allergies indicates no known allergies.  Home Medications   Prior to Admission medications   Medication Sig Start Date End Date Taking? Authorizing Provider  albuterol (PROVENTIL HFA;VENTOLIN HFA) 108 (90 Base) MCG/ACT inhaler Inhale 1-2 puffs into the lungs every 6 (six) hours as needed for wheezing or shortness of breath. 04/01/15  Yes Audry Pili, PA-C  aspirin 325 MG tablet Take 325 mg by mouth daily.    Yes Historical Provider, MD  omeprazole (PRILOSEC) 20 MG capsule Take 1 capsule (20 mg total) by mouth daily. Frank taking differently: Take 20 mg by mouth daily as needed (heartburn).  04/18/14  Yes Azalia Bilis, MD  ranitidine (ZANTAC) 150 MG tablet Take 150 mg by mouth 2 (two) times daily as needed for heartburn.   Yes Historical Provider, MD  predniSONE (DELTASONE) 20 MG tablet Take 1 tablet (20 mg total) by mouth 2 (two) times daily with a meal. Frank not taking: Reported on 07/29/2015 04/01/15   Audry Pili, PA-C   BP 154/127 mmHg  Pulse 82  Temp(Src) 98.5 F (  36.9 C) (Oral)  Resp 16  Ht  (1.575 m)  Wt 290 lb (131.543 kg)  BMI 53.03 kg/m2  SpO2 99%  LMP 07/02/2015 Physical Exam  Constitutional: She is oriented to person, place, and time. She appears well-developed and well-nourished. No distress.  HENT:  Head: Normocephalic and atraumatic.  Eyes: Conjunctivae are normal.  Neck: Normal range of motion. No JVD present.  Cardiovascular: Normal rate and regular rhythm.   Pulses:      Radial pulses are 2+ on the right side, and 2+ on the left side.  2+ radial pulses bilaterally.  Pulmonary/Chest: Effort normal. No respiratory distress.  She has no wheezes. She has no rhonchi. She has no rales.  Lungs CTA bilaterally.  Musculoskeletal: Normal range of motion. She exhibits no edema or tenderness.  No pitting edema. No calf tenderness. No unilateral leg swelling.  Neurological: She is alert and oriented to person, place, and time.  Skin: Skin is warm and dry.  Psychiatric: She has a normal mood and affect. Her behavior is normal.  Nursing note and vitals reviewed.   ED Course  Procedures   DIAGNOSTIC STUDIES: Oxygen Saturation is 99% on RA, normal by my interpretation.    COORDINATION OF CARE: 11:10 PM Discussed results of labs and EKG. Will evaluate pt for HEART score and re-check heart enzyme in 3 hours. Discussed treatment plan with pt at bedside and pt agreed to plan.   Labs Review Labs Reviewed  BASIC METABOLIC PANEL - Abnormal; Notable for the following:    Glucose, Bld 111 (*)    All other components within normal limits  CBC  I-STAT TROPOININ, ED  Rosezena Sensor, ED    Imaging Review Dg Chest 2 View  07/29/2015  CLINICAL DATA:  Frank does not feel well. Left-sided chest pain radiating to left jaw and left arm as well as back. EXAM: CHEST  2 VIEW COMPARISON:  04/01/2015 FINDINGS: Lungs are hypoinflated without focal consolidation or effusion. The cardiomediastinal silhouette is within normal. Remaining bony structures are normal. There are prominent overlying soft tissues. IMPRESSION: Hypoinflation without acute cardiopulmonary disease. Electronically Signed   By: Elberta Fortis M.D.   On: 07/29/2015 23:24   I have personally reviewed and evaluated these images and lab results as part of my medical decision-making.   EKG Interpretation   Date/Time:  Wednesday Jul 29 2015 22:36:47 EDT Ventricular Rate:  88 PR Interval:  165 QRS Duration: 86 QT Interval:  370 QTC Calculation: 448 R Axis:   63 Text Interpretation:  Sinus rhythm since last tracing no significant  change Confirmed by Effie Shy  MD,  Mechele Collin ([id number]) on 07/29/2015 10:43:03 PM      MDM   Final diagnoses:  Atypical chest pain   I personally performed the services described in this documentation, which was scribed in my presence. The recorded information has been reviewed and is accurate.  PT comes in with cc of chest pain. Atypical chest discomfort - but it was L sided and she had some vague symptoms in her jaw (tightness) and tingling in her extremity.  Pt's HEART SCORE IS 2 - 1 for hx and 1 for risk factors (obesity). Pt is PERC negative. EKG is unchanged.  Since she is chest pain free, we will get trops x 2, if neg, we will d/c her with Cards f/u. Strict ER return precautions have been discussed, and Frank is agreeing with the plan and is comfortable with the workup done and the recommendations from the  ER.    Derwood KaplanAnkit Reema Chick, MD 07/30/15 [phone]

## 2015-07-29 NOTE — ED Notes (Signed)
Pt states that she felt bad this am; pt states that it was nothing specific but that she just didn't feel well; pt states that she rested and ate something and went to work and felt fine until approx 2 hrs; pt reports that she had a sudden onset of left sided chest pain that radiated to left side of jaw, left arm and to her back; pt states that the intense pain lasted a few seconds and that it comes and goes now but not as intense; pt states that it now "uncomfortable"

## 2015-07-30 LAB — I-STAT TROPONIN, ED: Troponin i, poc: 0 ng/mL (ref 0.00–0.08)

## 2015-07-30 NOTE — ED Notes (Signed)
Dr. Rhunette CroftNanavati informed of pt's b/p.  No new orders received.

## 2015-07-30 NOTE — Discharge Instructions (Signed)
We saw you in the ER for the chest discomfort. All of our cardiac workup is normal, including labs, EKG and chest X-RAY are normal. We are not sure what is causing your discomfort, but we feel comfortable sending you home at this time. The workup in the ER is not complete, and you should follow up with your primary care doctor for further evaluation.  Please return to the ER if you have worsening chest pain, shortness of breath, pain radiating to your jaw, shoulder, or back, sweats or fainting. Otherwise see the Cardiologist or your primary care doctor as requested.    Nonspecific Chest Pain  Chest pain can be caused by many different conditions. There is always a chance that your pain could be related to something serious, such as a heart attack or a blood clot in your lungs. Chest pain can also be caused by conditions that are not life-threatening. If you have chest pain, it is very important to follow up with your health care provider. CAUSES  Chest pain can be caused by:  Heartburn.  Pneumonia or bronchitis.  Anxiety or stress.  Inflammation around your heart (pericarditis) or lung (pleuritis or pleurisy).  A blood clot in your lung.  A collapsed lung (pneumothorax). It can develop suddenly on its own (spontaneous pneumothorax) or from trauma to the chest.  Shingles infection (varicella-zoster virus).  Heart attack.  Damage to the bones, muscles, and cartilage that make up your chest wall. This can include:  Bruised bones due to injury.  Strained muscles or cartilage due to frequent or repeated coughing or overwork.  Fracture to one or more ribs.  Sore cartilage due to inflammation (costochondritis). RISK FACTORS  Risk factors for chest pain may include:  Activities that increase your risk for trauma or injury to your chest.  Respiratory infections or conditions that cause frequent coughing.  Medical conditions or overeating that can cause heartburn.  Heart disease  or family history of heart disease.  Conditions or health behaviors that increase your risk of developing a blood clot.  Having had chicken pox (varicella zoster). SIGNS AND SYMPTOMS Chest pain can feel like:  Burning or tingling on the surface of your chest or deep in your chest.  Crushing, pressure, aching, or squeezing pain.  Dull or sharp pain that is worse when you move, cough, or take a deep breath.  Pain that is also felt in your back, neck, shoulder, or arm, or pain that spreads to any of these areas. Your chest pain may come and go, or it may stay constant. DIAGNOSIS Lab tests or other studies may be needed to find the cause of your pain. Your health care provider may have you take a test called an ambulatory ECG (electrocardiogram). An ECG records your heartbeat patterns at the time the test is performed. You may also have other tests, such as:  Transthoracic echocardiogram (TTE). During echocardiography, sound waves are used to create a picture of all of the heart structures and to look at how blood flows through your heart.  Transesophageal echocardiogram (TEE).This is a more advanced imaging test that obtains images from inside your body. It allows your health care provider to see your heart in finer detail.  Cardiac monitoring. This allows your health care provider to monitor your heart rate and rhythm in real time.  Holter monitor. This is a portable device that records your heartbeat and can help to diagnose abnormal heartbeats. It allows your health care provider to track your heart  activity for several days, if needed.  Stress tests. These can be done through exercise or by taking medicine that makes your heart beat more quickly.  Blood tests.  Imaging tests. TREATMENT  Your treatment depends on what is causing your chest pain. Treatment may include:  Medicines. These may include:  Acid blockers for heartburn.  Anti-inflammatory medicine.  Pain medicine for  inflammatory conditions.  Antibiotic medicine, if an infection is present.  Medicines to dissolve blood clots.  Medicines to treat coronary artery disease.  Supportive care for conditions that do not require medicines. This may include:  Resting.  Applying heat or cold packs to injured areas.  Limiting activities until pain decreases. HOME CARE INSTRUCTIONS  If you were prescribed an antibiotic medicine, finish it all even if you start to feel better.  Avoid any activities that bring on chest pain.  Do not use any tobacco products, including cigarettes, chewing tobacco, or electronic cigarettes. If you need help quitting, ask your health care provider.  Do not drink alcohol.  Take medicines only as directed by your health care provider.  Keep all follow-up visits as directed by your health care provider. This is important. This includes any further testing if your chest pain does not go away.  If heartburn is the cause for your chest pain, you may be told to keep your head raised (elevated) while sleeping. This reduces the chance that acid will go from your stomach into your esophagus.  Make lifestyle changes as directed by your health care provider. These may include:  Getting regular exercise. Ask your health care provider to suggest some activities that are safe for you.  Eating a heart-healthy diet. A registered dietitian can help you to learn healthy eating options.  Maintaining a healthy weight.  Managing diabetes, if necessary.  Reducing stress. SEEK MEDICAL CARE IF:  Your chest pain does not go away after treatment.  You have a rash with blisters on your chest.  You have a fever. SEEK IMMEDIATE MEDICAL CARE IF:   Your chest pain is worse.  You have an increasing cough, or you cough up blood.  You have severe abdominal pain.  You have severe weakness.  You faint.  You have chills.  You have sudden, unexplained chest discomfort.  You have sudden,  unexplained discomfort in your arms, back, neck, or jaw.  You have shortness of breath at any time.  You suddenly start to sweat, or your skin gets clammy.  You feel nauseous or you vomit.  You suddenly feel light-headed or dizzy.  Your heart begins to beat quickly, or it feels like it is skipping beats. These symptoms may represent a serious problem that is an emergency. Do not wait to see if the symptoms will go away. Get medical help right away. Call your local emergency services (911 in the [place] Do not drive yourself to the hospital.   This information is not intended to replace advice given to you by your health care provider. Make sure you discuss any questions you have with your health care provider.   Document Released: 12/01/2004 Document Revised: 03/14/2014 Document Reviewed: 09/27/2013 Elsevier Interactive Patient Education [employer]

## 2015-10-08 ENCOUNTER — Encounter (HOSPITAL_COMMUNITY): Payer: Self-pay | Admitting: Emergency Medicine

## 2015-10-08 ENCOUNTER — Emergency Department (HOSPITAL_COMMUNITY): Payer: Self-pay

## 2015-10-08 ENCOUNTER — Emergency Department (HOSPITAL_COMMUNITY)
Admission: EM | Admit: 2015-10-08 | Discharge: 2015-10-08 | Disposition: A | Discharge status: Home / Self Care | Payer: Self-pay | Attending: Emergency Medicine | Admitting: Emergency Medicine

## 2015-10-08 DIAGNOSIS — Y929 Unspecified place or not applicable: Secondary | ICD-10-CM | POA: Insufficient documentation

## 2015-10-08 DIAGNOSIS — Z79899 Other long term (current) drug therapy: Secondary | ICD-10-CM | POA: Insufficient documentation

## 2015-10-08 DIAGNOSIS — W57XXXA Bitten or stung by nonvenomous insect and other nonvenomous arthropods, initial encounter: Secondary | ICD-10-CM | POA: Insufficient documentation

## 2015-10-08 DIAGNOSIS — Y999 Unspecified external cause status: Secondary | ICD-10-CM | POA: Insufficient documentation

## 2015-10-08 DIAGNOSIS — I10 Essential (primary) hypertension: Secondary | ICD-10-CM | POA: Insufficient documentation

## 2015-10-08 DIAGNOSIS — Z7982 Long term (current) use of aspirin: Secondary | ICD-10-CM | POA: Insufficient documentation

## 2015-10-08 DIAGNOSIS — S70261A Insect bite (nonvenomous), right hip, initial encounter: Secondary | ICD-10-CM | POA: Insufficient documentation

## 2015-10-08 DIAGNOSIS — J45909 Unspecified asthma, uncomplicated: Secondary | ICD-10-CM | POA: Insufficient documentation

## 2015-10-08 DIAGNOSIS — M791 Myalgia, unspecified site: Secondary | ICD-10-CM

## 2015-10-08 DIAGNOSIS — R002 Palpitations: Secondary | ICD-10-CM | POA: Insufficient documentation

## 2015-10-08 DIAGNOSIS — Y939 Activity, unspecified: Secondary | ICD-10-CM | POA: Insufficient documentation

## 2015-10-08 LAB — I-STAT CHEM 8, ED
BUN: 20 mg/dL (ref 6–20)
CALCIUM ION: 1.13 mmol/L (ref 1.13–1.30)
Chloride: 102 mmol/L (ref 101–111)
Creatinine, Ser: 0.7 mg/dL (ref 0.44–1.00)
GLUCOSE: 127 mg/dL — AB (ref 65–99)
HCT: 42 % (ref 36.0–46.0)
HEMOGLOBIN: 14.3 g/dL (ref 12.0–15.0)
Potassium: 4 mmol/L (ref 3.5–5.1)
Sodium: 139 mmol/L (ref 135–145)
TCO2: 26 mmol/L (ref 0–100)

## 2015-10-08 LAB — URINALYSIS, ROUTINE W REFLEX MICROSCOPIC
Bilirubin Urine: NEGATIVE
GLUCOSE, UA: NEGATIVE mg/dL
HGB URINE DIPSTICK: NEGATIVE
KETONES UR: NEGATIVE mg/dL
Leukocytes, UA: NEGATIVE
Nitrite: NEGATIVE
PROTEIN: NEGATIVE mg/dL
Specific Gravity, Urine: 1.012 (ref 1.005–1.030)
pH: 5.5 (ref 5.0–8.0)

## 2015-10-08 LAB — CBC WITH DIFFERENTIAL/PLATELET
Basophils Absolute: 0 10*3/uL (ref 0.0–0.1)
Basophils Relative: 0 %
EOS PCT: 2 %
Eosinophils Absolute: 0.2 10*3/uL (ref 0.0–0.7)
HCT: 39.5 % (ref 36.0–46.0)
Hemoglobin: 12.7 g/dL (ref 12.0–15.0)
LYMPHS ABS: 3.7 10*3/uL (ref 0.7–4.0)
LYMPHS PCT: 41 %
MCH: 26.5 pg (ref 26.0–34.0)
MCHC: 32.2 g/dL (ref 30.0–36.0)
MCV: 82.3 fL (ref 78.0–100.0)
MONO ABS: 0.6 10*3/uL (ref 0.1–1.0)
Monocytes Relative: 6 %
Neutro Abs: 4.6 10*3/uL (ref 1.7–7.7)
Neutrophils Relative %: 51 %
PLATELETS: 311 10*3/uL (ref 150–400)
RBC: 4.8 MIL/uL (ref 3.87–5.11)
RDW: 13.6 % (ref 11.5–15.5)
WBC: 9.2 10*3/uL (ref 4.0–10.5)

## 2015-10-08 LAB — POC URINE PREG, ED: PREG TEST UR: NEGATIVE

## 2015-10-08 MED ORDER — DOXYCYCLINE HYCLATE 100 MG PO TABS
100.0000 mg | ORAL_TABLET | Freq: Once | ORAL | Status: AC
  Administered 2015-10-08: 100 mg via ORAL
  Filled 2015-10-08: qty 1

## 2015-10-08 MED ORDER — DOXYCYCLINE HYCLATE 100 MG PO CAPS: 100.0000 mg | ORAL_CAPSULE | Freq: Two times a day (BID) | ORAL | 0 refills | Status: AC

## 2015-10-08 NOTE — ED Provider Notes (Signed)
[facility]-EMERGENCY DEPT Provider Note   CSN: [id number] Arrival date & time: 10/08/15  0116  First Provider Contact:  2:19AM    By signing my name below, I, Vista Mink, attest that this documentation has been prepared under the direction and in the presence of Earley Favor NP.  Electronically Signed: Vista Mink, ED Scribe. 10/08/15. 2:19 AM.   History   Chief Complaint Chief Complaint  Patient presents with  . multiple complaints    generalized not feeling well    HPI HPI Comments: Alexandra Frank is a 40 y.o. female who presents to the Emergency Department with multiple complaints that have been persistent for the past two weeks. Pt first complains of constant generalized fatigue. Pt states she came home from work today and reports sudden onset shortness of breath. Pt then states she stood up and felt dizzy with a sharp pain to her lower back. Pt states she has been having these symptoms intermittently for the past two weeks. Pt reports that it felt like her heart was "racing" tonight and could feel it "thumping". Pt also complains of constant fatigue for the past two weeks and difficulty getting out of bed recently due to her fatigue. Pt states she does have asthma but does not frequently use an inhaler. Pt further states she noticed a tic bite one month ago that was to the right side of her torso and removed the tick without any difficulties; did notice a rash. Pt states she has irregular periods for the past few years and reports LNMP middle of June. Pt denies any recent long distance travel, current birth control use, Hx of blood transfusions. Pt reports a family Hx of DM, CHF and ESRD. Pt currently takes aspirin qid, and OTC medication for GERD. Pt denies nausea, vomiting, diarrhea.      The history is provided by the patient. No language interpreter was used.    Past Medical History:  Diagnosis Date  . Hypertension   . Thyroid disease    denies 10/08/2015    There are no active  problems to display for this patient.   Past Surgical History:  Procedure Laterality Date  . CHOLECYSTECTOMY      OB History    No data available       Home Medications    Prior to Admission medications   Medication Sig Start Date End Date Taking? Authorizing Provider  albuterol (PROVENTIL HFA;VENTOLIN HFA) 108 (90 Base) MCG/ACT inhaler Inhale 1-2 puffs into the lungs every 6 (six) hours as needed for wheezing or shortness of breath. 04/01/15  Yes Audry Pili, PA-C  aspirin EC 81 MG tablet Take 81 mg by mouth daily.   Yes Historical Provider, MD  ranitidine (ZANTAC) 150 MG tablet Take 150 mg by mouth 2 (two) times daily as needed for heartburn.   Yes Historical Provider, MD  doxycycline (VIBRAMYCIN) 100 MG capsule Take 1 capsule (100 mg total) by mouth 2 (two) times daily. 10/08/15   Earley Favor, NP  omeprazole (PRILOSEC) 20 MG capsule Take 1 capsule (20 mg total) by mouth daily. Patient taking differently: Take 20 mg by mouth daily as needed (heartburn).  04/18/14   Azalia Bilis, MD  predniSONE (DELTASONE) 20 MG tablet Take 1 tablet (20 mg total) by mouth 2 (two) times daily with a meal. Patient not taking: Reported on 07/29/2015 04/01/15   Audry Pili, PA-C    Family History Family History  Problem Relation Age of Onset  . Diabetes Mother   . Kidney  disease Mother   . Alcohol abuse Father   . Diabetes Sister   . Heart disease Sister   . Mental retardation Sister     Social History Social History  Substance Use Topics  . Smoking status: Never Smoker  . Smokeless tobacco: Never Used  . Alcohol use Yes     Comment: occ     Allergies   Review of patient's allergies indicates no known allergies.   Review of Systems Review of Systems  Constitutional: Positive for fatigue.  Respiratory: Positive for shortness of breath.   Cardiovascular: Positive for palpitations ("racing").  Gastrointestinal: Negative for diarrhea, nausea and vomiting.  Musculoskeletal: Positive for  back pain (lower).  Neurological: Positive for dizziness.  All other systems reviewed and are negative.    Physical Exam Updated Vital Signs BP 156/92   Pulse 78   Temp 98.6 F (37 C)   Resp 14   LMP 08/19/2015 (Approximate)   SpO2 98%   Physical Exam  Constitutional: She is oriented to person, place, and time. She appears well-developed and well-nourished. No distress.  HENT:  Head: Normocephalic and atraumatic.  Eyes: Conjunctivae and EOM are normal. Pupils are equal, round, and reactive to light.  Neck: Normal range of motion. No thyromegaly present.  Cardiovascular: Normal rate, regular rhythm and normal heart sounds.   Neurological: She is alert and oriented to person, place, and time.  Skin: Skin is warm and dry. She is not diaphoretic.  Psychiatric: She has a normal mood and affect. Judgment normal.  Nursing note and vitals reviewed.    ED Treatments / Results  DIAGNOSTIC STUDIES: Oxygen Saturation is 95% on RA, normal by my interpretation.  COORDINATION OF CARE: 2:20 AM-Will order UA and blood work. Discussed treatment plan with pt at bedside and pt agreed to plan.   Labs (all labs ordered are listed, but only abnormal results are displayed) Labs Reviewed  URINALYSIS, ROUTINE W REFLEX MICROSCOPIC (NOT AT [facility]) - Abnormal; Notable for the following:       Result Value   APPearance CLOUDY (*)    All other components within normal limits  I-STAT CHEM 8, ED - Abnormal; Notable for the following:    Glucose, Bld 127 (*)    All other components within normal limits  CBC WITH DIFFERENTIAL/PLATELET  POC URINE PREG, ED    EKG  EKG Interpretation  Date/Time:  Thursday October 08 2015 01:27:40 EDT Ventricular Rate:  88 PR Interval:    QRS Duration: 86 QT Interval:  358 QTC Calculation: 434 R Axis:   51 Text Interpretation:  Sinus rhythm Confirmed by HORTON  MD, COURTNEY ([id number]) on 10/08/2015 4:02:53 AM       Radiology Dg Chest 2 View  Result Date:  10/08/2015 CLINICAL DATA:  40 year old female with fatigue and shortness of breath EXAM: CHEST  2 VIEW COMPARISON:  Chest radiograph dated 07/29/2015 FINDINGS: The heart size and mediastinal contours are within normal limits. Both lungs are clear. The visualized skeletal structures are unremarkable. IMPRESSION: No active cardiopulmonary disease. Electronically Signed   By: Elgie Collard M.D.   On: 10/08/2015 04:12    Procedures Procedures (including critical care time)  Medications Ordered in ED Medications  doxycycline (VIBRA-TABS) tablet 100 mg (not administered)     Initial Impression / Assessment and Plan / ED Course  I have reviewed the triage vital signs and the nursing notes.  Pertinent labs & imaging results that were available during my care of the patient were reviewed by  me and considered in my medical decision making (see chart for details).  Clinical Course    Will start Doxycycline for 7 days for potential Lymes Dx due to recent Tick bite and vague symptoms without specific cause  Patient is in agreement to this plan    Final Clinical Impressions(s) / ED Diagnoses   Final diagnoses:  Myalgia  Tick bite of hip, right, initial encounter    New Prescriptions New Prescriptions   DOXYCYCLINE (VIBRAMYCIN) 100 MG CAPSULE    Take 1 capsule (100 mg total) by mouth 2 (two) times daily.      Earley Favor, NP 10/08/15 [id number]    Shon Baton, MD 10/08/15 [phone]

## 2015-10-08 NOTE — Discharge Instructions (Signed)
You have been started on Doxycycline as a precaution due to the recent Tick bite.  The rest of your evaluation is normal blood sugar normal, no indication of infection, normal urine and chest xray  Please take the antibiotic as directed until all tablets taken  Follow up with your PCP as needed

## 2015-10-08 NOTE — ED Triage Notes (Signed)
Patient reports not feeling well for the past two weeks. Patient states she has been stuttering, feeling dizzy, having low back pain, fatigued, and SOB. Patient states tonight she got scared tonight because of all the symptoms. She states her pulse was racing tonight, and she could feel her heart "thumping". Patient A&Ox4, AD noted. EKG done.

## 2016-02-10 IMAGING — CR DG CHEST 2V
2 series · 2 of 2 positions shown · non-contrast
Comparison: 11/12/2014 and earlier.

CLINICAL DATA: 39-year-old female with shortness of Breath. Mid and
left upper back pain for 2 days. Initial encounter.

EXAM:
CHEST  2 VIEW

[w chest pa]
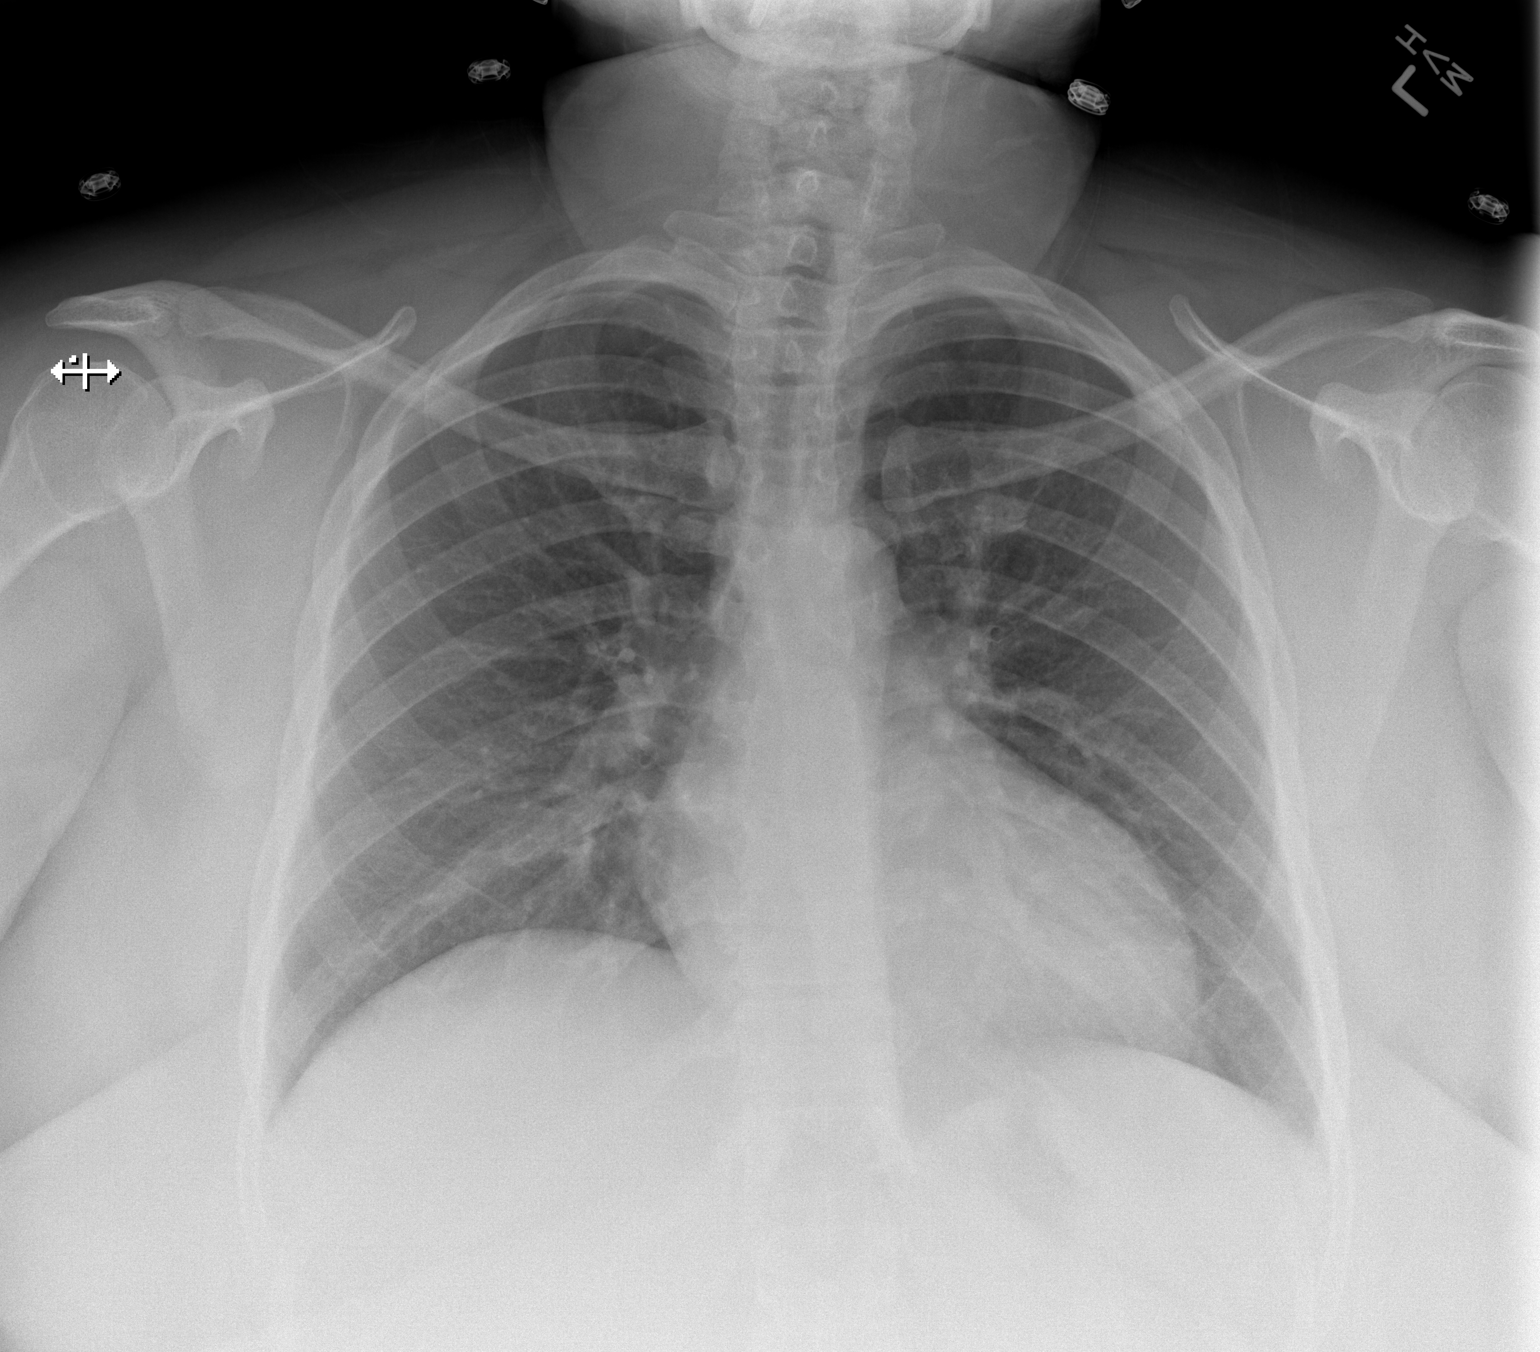

[w chest lat]
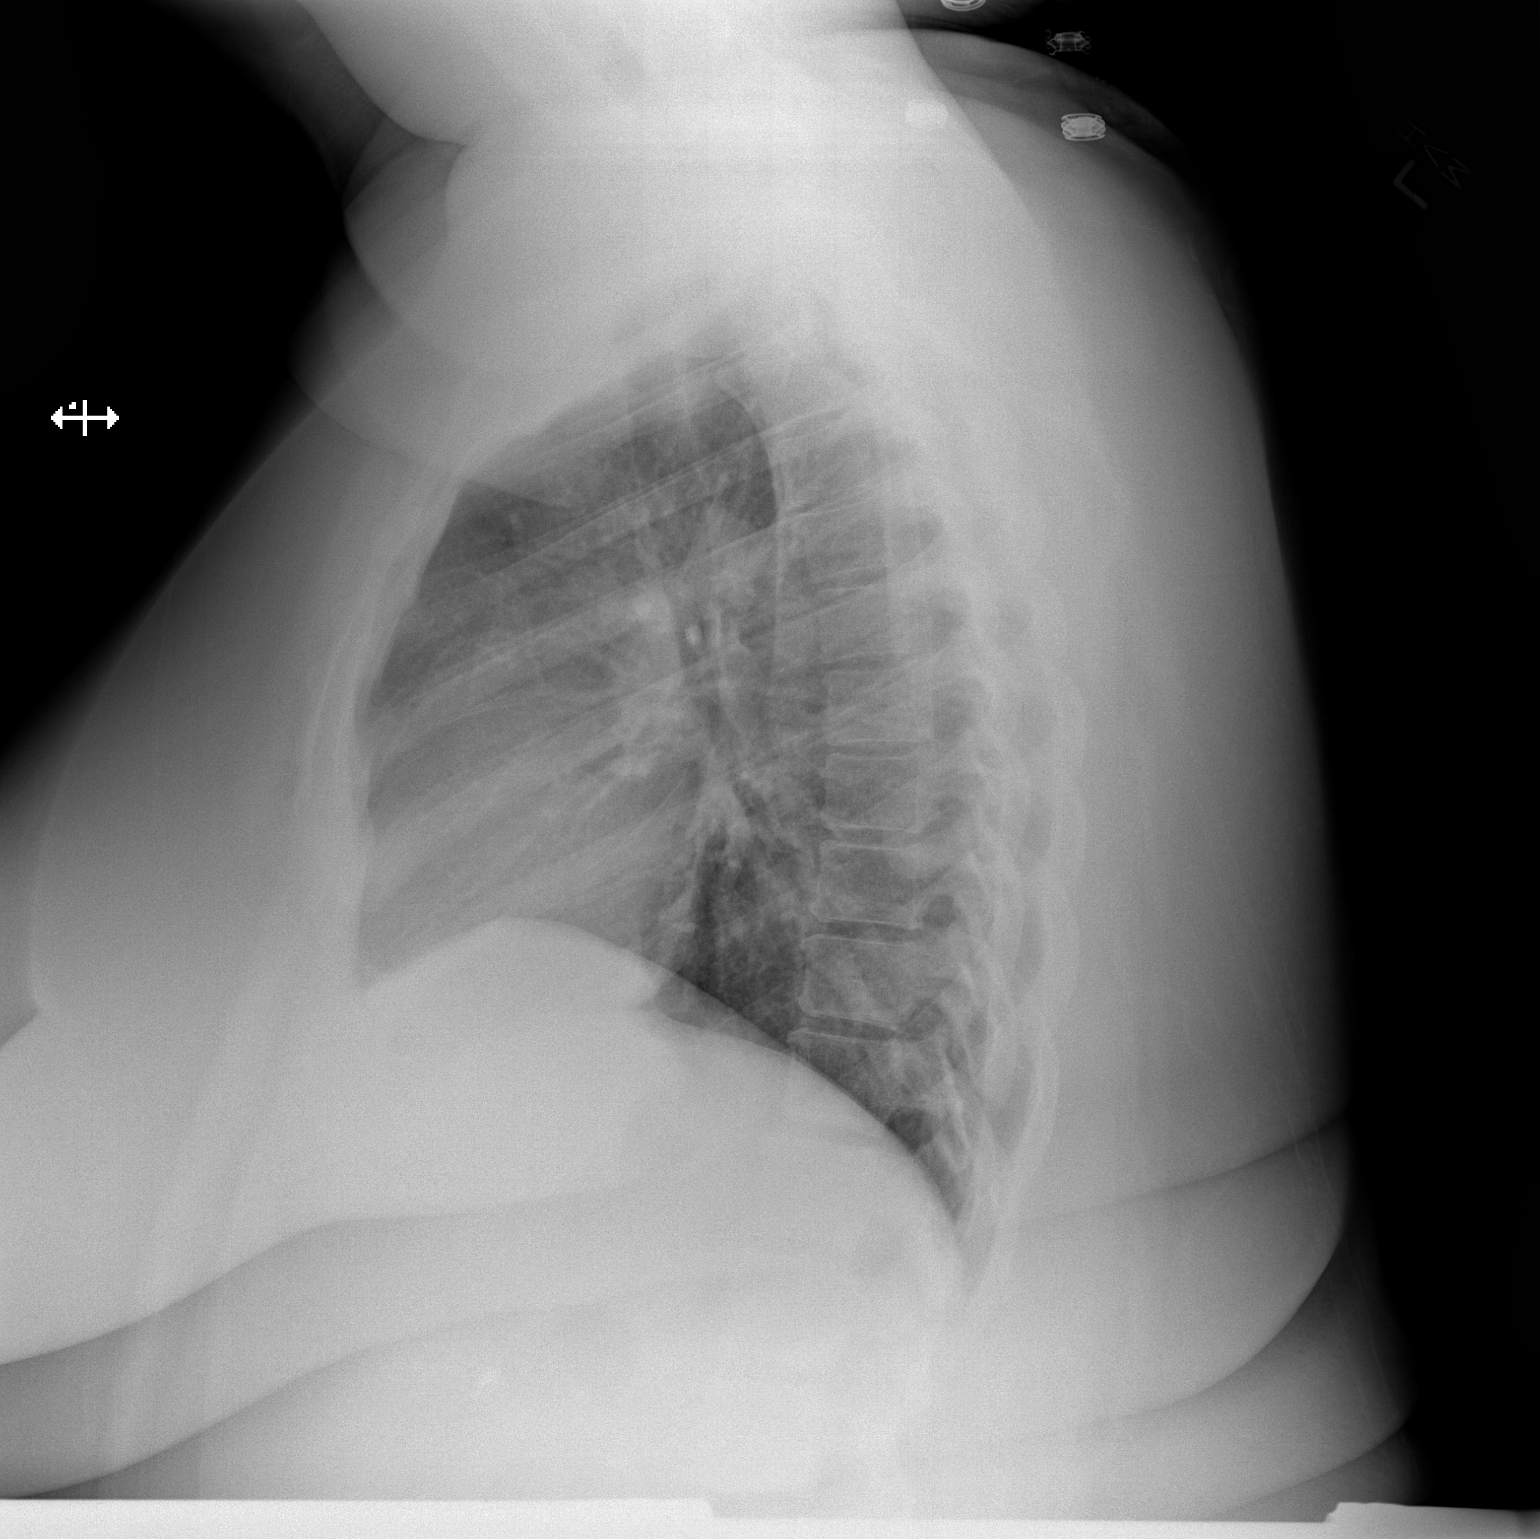

[2 of 2 positions shown; findings below may reference images not displayed]

FINDINGS: Large body habitus. Lung volumes are stable and within normal
limits. Stable cardiac size at the upper limits of normal. Other
mediastinal contours are within normal limits. Visualized tracheal
air column is within normal limits. The lungs remain clear. No
pneumothorax or effusion. Stable cholecystectomy clips. No acute
osseous abnormality identified.
IMPRESSION: No acute cardiopulmonary abnormality.
